# Patient Record
Sex: Female | Born: 1960 | Race: White | Hispanic: No | Marital: Married | Smoking: Never smoker
Health system: Southern US, Community
[De-identification: ages and names within clinical notes are randomized; demographics above are authoritative.]

## PROBLEM LIST (undated history)

## (undated) DIAGNOSIS — R896 Abnormal cytological findings in specimens from other organs, systems and tissues: Secondary | ICD-10-CM

## (undated) DIAGNOSIS — F32A Depression, unspecified: Secondary | ICD-10-CM

## (undated) DIAGNOSIS — F419 Anxiety disorder, unspecified: Secondary | ICD-10-CM

## (undated) DIAGNOSIS — M1990 Unspecified osteoarthritis, unspecified site: Secondary | ICD-10-CM

## (undated) DIAGNOSIS — H3530 Unspecified macular degeneration: Secondary | ICD-10-CM

## (undated) DIAGNOSIS — F329 Major depressive disorder, single episode, unspecified: Secondary | ICD-10-CM

## (undated) HISTORY — DX: Unspecified osteoarthritis, unspecified site: M19.90

## (undated) HISTORY — DX: Anxiety disorder, unspecified: F41.9

## (undated) HISTORY — DX: Unspecified macular degeneration: H35.30

## (undated) HISTORY — DX: Major depressive disorder, single episode, unspecified: F32.9

## (undated) HISTORY — DX: Abnormal cytological findings in specimens from other organs, systems and tissues: R89.6

## (undated) HISTORY — PX: BREAST CYST ASPIRATION: SHX578

## (undated) HISTORY — PX: TONSILLECTOMY AND ADENOIDECTOMY: SUR1326

## (undated) HISTORY — DX: Depression, unspecified: F32.A

---

## 1997-10-16 ENCOUNTER — Other Ambulatory Visit: Admission: RE | Admit: 1997-10-16 | Discharge: 1997-10-16 | Payer: Self-pay | Admitting: Obstetrics and Gynecology

## 1998-07-04 ENCOUNTER — Inpatient Hospital Stay (HOSPITAL_COMMUNITY): Admission: AD | Admit: 1998-07-04 | Discharge: 1998-07-06 | Payer: Self-pay | Admitting: Obstetrics and Gynecology

## 1998-08-26 ENCOUNTER — Other Ambulatory Visit: Admission: RE | Admit: 1998-08-26 | Discharge: 1998-08-26 | Payer: Self-pay | Admitting: Obstetrics and Gynecology

## 1999-11-25 ENCOUNTER — Other Ambulatory Visit: Admission: RE | Admit: 1999-11-25 | Discharge: 1999-11-25 | Payer: Self-pay | Admitting: Gynecology

## 2001-02-22 ENCOUNTER — Other Ambulatory Visit: Admission: RE | Admit: 2001-02-22 | Discharge: 2001-02-22 | Payer: Self-pay | Admitting: Gynecology

## 2002-03-07 ENCOUNTER — Other Ambulatory Visit: Admission: RE | Admit: 2002-03-07 | Discharge: 2002-03-07 | Payer: Self-pay | Admitting: Gynecology

## 2002-08-25 ENCOUNTER — Other Ambulatory Visit: Admission: RE | Admit: 2002-08-25 | Discharge: 2002-08-25 | Payer: Self-pay | Admitting: Gynecology

## 2003-04-19 ENCOUNTER — Other Ambulatory Visit: Admission: RE | Admit: 2003-04-19 | Discharge: 2003-04-19 | Payer: Self-pay | Admitting: Gynecology

## 2004-11-18 ENCOUNTER — Other Ambulatory Visit: Admission: RE | Admit: 2004-11-18 | Discharge: 2004-11-18 | Payer: Self-pay | Admitting: Gynecology

## 2005-12-01 ENCOUNTER — Other Ambulatory Visit: Admission: RE | Admit: 2005-12-01 | Discharge: 2005-12-01 | Payer: Self-pay | Admitting: Gynecology

## 2006-03-20 ENCOUNTER — Ambulatory Visit: Payer: Self-pay | Admitting: Family Medicine

## 2007-01-27 ENCOUNTER — Other Ambulatory Visit: Admission: RE | Admit: 2007-01-27 | Discharge: 2007-01-27 | Payer: Self-pay | Admitting: Gynecology

## 2007-04-28 ENCOUNTER — Other Ambulatory Visit: Admission: RE | Admit: 2007-04-28 | Discharge: 2007-04-28 | Payer: Self-pay | Admitting: Gynecology

## 2007-11-07 ENCOUNTER — Other Ambulatory Visit: Admission: RE | Admit: 2007-11-07 | Discharge: 2007-11-07 | Payer: Self-pay | Admitting: Gynecology

## 2007-11-07 ENCOUNTER — Ambulatory Visit: Payer: Self-pay | Admitting: Gynecology

## 2007-11-07 ENCOUNTER — Encounter: Payer: Self-pay | Admitting: Gynecology

## 2008-04-18 ENCOUNTER — Ambulatory Visit: Payer: Self-pay | Admitting: Gynecology

## 2008-04-18 ENCOUNTER — Other Ambulatory Visit: Admission: RE | Admit: 2008-04-18 | Discharge: 2008-04-18 | Payer: Self-pay | Admitting: Gynecology

## 2008-04-18 ENCOUNTER — Encounter: Payer: Self-pay | Admitting: Gynecology

## 2009-03-07 ENCOUNTER — Encounter: Admission: RE | Admit: 2009-03-07 | Discharge: 2009-03-07 | Payer: Self-pay | Admitting: Gynecology

## 2010-01-05 LAB — HM PAP SMEAR

## 2010-02-28 ENCOUNTER — Other Ambulatory Visit: Payer: Self-pay | Admitting: Gynecology

## 2010-02-28 DIAGNOSIS — Z1231 Encounter for screening mammogram for malignant neoplasm of breast: Secondary | ICD-10-CM

## 2010-03-11 ENCOUNTER — Ambulatory Visit: Payer: Self-pay

## 2010-03-17 ENCOUNTER — Ambulatory Visit
Admission: RE | Admit: 2010-03-17 | Discharge: 2010-03-17 | Disposition: A | Discharge status: Home / Self Care | Payer: Commercial Managed Care - PPO | Source: Ambulatory Visit | Attending: Gynecology | Admitting: Gynecology

## 2010-03-17 DIAGNOSIS — Z1231 Encounter for screening mammogram for malignant neoplasm of breast: Secondary | ICD-10-CM

## 2010-12-01 DIAGNOSIS — Z9852 Vasectomy status: Secondary | ICD-10-CM | POA: Insufficient documentation

## 2011-03-05 ENCOUNTER — Other Ambulatory Visit: Payer: Self-pay | Admitting: Obstetrics and Gynecology

## 2011-03-05 DIAGNOSIS — Z1231 Encounter for screening mammogram for malignant neoplasm of breast: Secondary | ICD-10-CM

## 2011-03-18 ENCOUNTER — Ambulatory Visit
Admission: RE | Admit: 2011-03-18 | Discharge: 2011-03-18 | Disposition: A | Discharge status: Home / Self Care | Payer: Commercial Managed Care - PPO | Source: Ambulatory Visit | Attending: Obstetrics and Gynecology | Admitting: Obstetrics and Gynecology

## 2011-03-18 DIAGNOSIS — Z1231 Encounter for screening mammogram for malignant neoplasm of breast: Secondary | ICD-10-CM

## 2011-03-18 LAB — HM MAMMOGRAPHY

## 2012-11-24 ENCOUNTER — Other Ambulatory Visit: Payer: Self-pay

## 2013-02-27 ENCOUNTER — Encounter: Payer: Self-pay | Admitting: Gastroenterology

## 2013-03-01 ENCOUNTER — Ambulatory Visit: Payer: Self-pay | Admitting: Obstetrics and Gynecology

## 2013-03-01 ENCOUNTER — Encounter: Payer: Self-pay | Admitting: Obstetrics and Gynecology

## 2013-03-01 ENCOUNTER — Ambulatory Visit (INDEPENDENT_AMBULATORY_CARE_PROVIDER_SITE_OTHER): Payer: Commercial Managed Care - PPO | Admitting: Obstetrics and Gynecology

## 2013-03-01 VITALS — BP 122/70 | HR 70 | Resp 16 | Ht 68.0 in | Wt 157.5 lb

## 2013-03-01 DIAGNOSIS — Z01419 Encounter for gynecological examination (general) (routine) without abnormal findings: Secondary | ICD-10-CM

## 2013-03-01 NOTE — Patient Instructions (Signed)

## 2013-03-01 NOTE — Progress Notes (Signed)
Screening Mammogram scheduled for 03/07/13 at 1630 [facility]. Patient agreeable to time/date/location.

## 2013-03-01 NOTE — Progress Notes (Signed)
Patient ID: Brenda Nicholson, female   DOB: 11/24/1960, 53 y.o.   MRN: [id number] GYNECOLOGY VISIT  PCP:   Rodrigo RanMark Perini, MD  Referring provider:   HPI: [id number] y.o.   Married  Caucasian  female   G3P3 with Patient's last menstrual period was 12/05/2012.   here for   AEX. Missed first menses ever. Menses usually heavy.  Notes an increase in anxiety and worrying.  History of depression.  Limiting ETOH use and drinks a lot of tea and coffee.  3 teen age children.  No hot flashes or night sweats.   Some urinary frequency.  Negative UA with PCP this week.   Hgb:    PCP - labs this week with PCP Urine:  PCP  GYNECOLOGIC HISTORY: Patient's last menstrual period was 12/05/2012. Sexually active:  yes Partner preference: female Contraception: vasectomy   Menopausal hormone therapy: n/a DES exposure: no  Blood transfusions:   no Sexually transmitted diseases:   no GYN procedures and prior surgeries:  no Last mammogram:  3-13- 2013 [facility]              Last pap and high risk HPV testing:  01/2010 WUJ:WJXBJYwnl:unsure of HPV testing History of abnormal pap smear:  no   OB History   Grav Para Term Preterm Abortions TAB SAB Ect Mult Living   3 3        3        LIFESTYLE: Exercise:  walking          Tobacco: no Alcohol:    4 glasses of wine per week Drug use:  no  OTHER HEALTH MAINTENANCE: Tetanus/TDap:  2013 Gardisil:              n/a Influenza:            Not in years Zostavax:            n/a  Bone density:      n/a Colonoscopy:      Scheduled for first screening colonoscopy 04/2013 with [facility].  Cholesterol check:   02-13-2011 wnl  Family History  Problem Relation Age of Onset  . Cancer Mother 4485    OVARIAN CANCER/1987  . Hypertension Mother   . Osteoporosis Mother   . Ovarian cancer Mother     deceased  . Rheum arthritis Paternal Grandmother     Patient Active Problem List   Diagnosis Date Noted  . H/O: vasectomy   . ASCUS (atypical squamous cells of undetermined  significance) on Pap smear    Past Medical History  Diagnosis Date  . ASCUS (atypical squamous cells of undetermined significance) on Pap smear 01/2007    NEG HIGH RISK HPV--BENIGN CHANGES ON 11/2007 PAP  . Anxiety   . Depression     in her 20's and off & on    Past Surgical History  Procedure Laterality Date  . Tonsillectomy and adenoidectomy      ALLERGIES: Review of patient's allergies indicates no known allergies.  No current outpatient prescriptions on file.   No current facility-administered medications for this visit.     ROS:  Pertinent items are noted in HPI.  SOCIAL HISTORY:  Married.  Writer/editor.   PHYSICAL EXAMINATION:    BP 122/70  Pulse 70  Resp 16  Ht 5\' 8"  (1.727 m)  Wt 157 lb 8 oz (71.442 kg)  BMI 23.95 kg/m2  LMP 12/05/2012   Wt Readings from Last 3 Encounters:  03/01/13 157 lb 8 oz (  71.442 kg)     Ht Readings from Last 3 Encounters:  03/01/13 5\' 8"  (1.727 m)    General appearance: alert, cooperative and appears stated age Head: Normocephalic, without obvious abnormality, atraumatic Neck: no adenopathy, supple, symmetrical, trachea midline and thyroid not enlarged, symmetric, no tenderness/mass/nodules Lungs: clear to auscultation bilaterally Breasts: Inspection negative, No nipple retraction or dimpling, No nipple discharge or bleeding, No axillary or supraclavicular adenopathy, Normal to palpation without dominant masses Heart: regular rate and rhythm Abdomen: soft, non-tender; no masses,  no organomegaly Extremities: extremities normal, atraumatic, no cyanosis or edema Skin: Skin color, texture, turgor normal. No rashes or lesions Lymph nodes: Cervical, supraclavicular, and axillary nodes normal. No abnormal inguinal nodes palpated Neurologic: Grossly normal  Pelvic: External genitalia:  no lesions              Urethra:  normal appearing urethra with no masses, tenderness or lesions              Bartholins and Skenes: normal                  Vagina: normal appearing vagina with normal color and discharge, no lesions              Cervix: normal appearance              Pap and high risk HPV testing done: yes.            Bimanual Exam:  Uterus:  uterus is normal size, shape, consistency and nontender                                      Adnexa: normal adnexa in size, nontender and no masses                                      Rectovaginal: Confirms                                      Anus:  normal sphincter tone, no lesions  ASSESSMENT  Normal gynecologic exam. Perimenopausal female. Anxiety. Urinary frequency.   PLAN  Mammogram recommended yearly.  Will schedule for patient today.  Pap smear and high risk HPV testing performed.  Reduce ETOH and caffeine use.  Referral to [facility].  Card given.  Counseled on self breast exam, Calcium and vitamin D intake, exercise, menopause.  Written information to patient about menopause.  Return annually or prn   An After Visit Summary was printed and given to the patient.

## 2013-03-03 LAB — IPS PAP TEST WITH HPV

## 2013-03-07 ENCOUNTER — Inpatient Hospital Stay: Admission: RE | Admit: 2013-03-07 | Payer: Commercial Managed Care - PPO | Source: Ambulatory Visit

## 2013-03-08 ENCOUNTER — Ambulatory Visit
Admission: RE | Admit: 2013-03-08 | Discharge: 2013-03-08 | Disposition: A | Discharge status: Home / Self Care | Payer: Commercial Managed Care - PPO | Source: Ambulatory Visit | Attending: Obstetrics and Gynecology | Admitting: Obstetrics and Gynecology

## 2013-03-08 DIAGNOSIS — Z01419 Encounter for gynecological examination (general) (routine) without abnormal findings: Secondary | ICD-10-CM

## 2013-05-01 ENCOUNTER — Encounter: Payer: Commercial Managed Care - PPO | Admitting: Gastroenterology

## 2013-05-10 DIAGNOSIS — H2510 Age-related nuclear cataract, unspecified eye: Secondary | ICD-10-CM | POA: Insufficient documentation

## 2013-05-10 DIAGNOSIS — Z8669 Personal history of other diseases of the nervous system and sense organs: Secondary | ICD-10-CM | POA: Insufficient documentation

## 2013-11-06 ENCOUNTER — Encounter: Payer: Self-pay | Admitting: Obstetrics and Gynecology

## 2013-11-24 ENCOUNTER — Telehealth: Payer: Self-pay

## 2013-11-24 NOTE — Telephone Encounter (Signed)
Spoke with patient. Patient states that she has had a constant pressure in her LLQ for 1 week. "I am not in pain. It is just a constant pressure that I feel. It is dull but I know it is there." Denies any bleeding, change in bowel habits, urinary symptoms, and nausea. Advised patient will need to be seen for evaluation. Patient is agreeable. Appointment scheduled for Monday at 10:30am with Verner Choleborah S. Leonard CNM. Patient is agreeable to date and time. Advised patient if symptoms worsen or any new symptoms will need to be seen sooner. Advised to call our office for earlier appointment or if anything changes over the weekend to be seen at [facility]. Patient is agreeable and verbalizes understanding.   Routing to Verner Choleborah S. Leonard CNM as seeing patient Cc: Dr.Silva  Routing to provider for final review. Patient agreeable to disposition. Will close encounter

## 2013-11-27 ENCOUNTER — Encounter: Payer: Self-pay | Admitting: Certified Nurse Midwife

## 2013-11-27 ENCOUNTER — Ambulatory Visit (INDEPENDENT_AMBULATORY_CARE_PROVIDER_SITE_OTHER): Payer: Commercial Managed Care - PPO | Admitting: Certified Nurse Midwife

## 2013-11-27 VITALS — BP 120/70 | HR 70 | Temp 98.0°F | Resp 16 | Ht 68.0 in | Wt 158.0 lb

## 2013-11-27 DIAGNOSIS — R102 Pelvic and perineal pain: Secondary | ICD-10-CM

## 2013-11-27 DIAGNOSIS — E559 Vitamin D deficiency, unspecified: Secondary | ICD-10-CM

## 2013-11-27 DIAGNOSIS — N912 Amenorrhea, unspecified: Secondary | ICD-10-CM

## 2013-11-27 DIAGNOSIS — N9489 Other specified conditions associated with female genital organs and menstrual cycle: Secondary | ICD-10-CM

## 2013-11-27 LAB — CBC WITH DIFFERENTIAL/PLATELET
BASOS PCT: 0 % (ref 0–1)
Basophils Absolute: 0 10*3/uL (ref 0.0–0.1)
Eosinophils Absolute: 0.1 10*3/uL (ref 0.0–0.7)
Eosinophils Relative: 3 % (ref 0–5)
HCT: 39 % (ref 36.0–46.0)
Hemoglobin: 13.5 g/dL (ref 12.0–15.0)
LYMPHS ABS: 1.4 10*3/uL (ref 0.7–4.0)
Lymphocytes Relative: 29 % (ref 12–46)
MCH: 30.5 pg (ref 26.0–34.0)
MCHC: 34.6 g/dL (ref 30.0–36.0)
MCV: 88.2 fL (ref 78.0–100.0)
MONO ABS: 0.4 10*3/uL (ref 0.1–1.0)
MONOS PCT: 8 % (ref 3–12)
MPV: 9.7 fL (ref 9.4–12.4)
NEUTROS PCT: 60 % (ref 43–77)
Neutro Abs: 2.9 10*3/uL (ref 1.7–7.7)
Platelets: 198 10*3/uL (ref 150–400)
RBC: 4.42 MIL/uL (ref 3.87–5.11)
RDW: 13.9 % (ref 11.5–15.5)
WBC: 4.8 10*3/uL (ref 4.0–10.5)

## 2013-11-27 LAB — POCT URINALYSIS DIPSTICK
Bilirubin, UA: NEGATIVE
GLUCOSE UA: NEGATIVE
KETONES UA: NEGATIVE
LEUKOCYTES UA: NEGATIVE
NITRITE UA: NEGATIVE
PH UA: 5
Protein, UA: NEGATIVE
RBC UA: NEGATIVE
UROBILINOGEN UA: NEGATIVE

## 2013-11-27 LAB — TSH: TSH: 1.835 u[IU]/mL (ref 0.350–4.500)

## 2013-11-27 NOTE — Patient Instructions (Signed)

## 2013-11-27 NOTE — Progress Notes (Signed)
53 y.o. Married white female  G3P3 here for complaint of pelvic pain particularly on left.  Pain started to be more uncomfortable over the past week.  Pain is primarily located in groin area. Patient has noticed some hot flashes and cramping like a period recently. Periods were normal until 03/05/13. She has not had a period since 3/15 which was normal. Denies any spotting also. Family history of ovarian cancer mother age 55. "says she is aware that there is no testing for, but concerned". Pain she has noted is a 2-3 and is occasional. Denies STD concerns, diarrhea, vomiting, urinary symptoms or vaginal symptoms. Patient has some fatigue and history of Vitamin D deficiency, which has not been assessed in over a year. Contraception: spouse vasectomy  Patient is sexually active with last sexual activity one week ago and no pain noted.  O: Healthy female WDWN in no apparent distress  Exam:   BP 120/70 mmHg  Pulse 70  Temp(Src) 98 F (36.7 C) (Oral)  Resp 16  Ht 5\' 8"  (1.727 m)  Wt 158 lb (71.668 kg)  BMI 24.03 kg/m2  LMP 03/05/2013  Skin: warm and dry Abdomen:  soft, non-tender; bowel sounds normal; no masses,  no organomegaly Lymph:  inguinal nodes not enlarged or tender   Pelvic: External genitalia:  no lesions and normal female              Urethra: not indicated and normal appearing urethra with no masses, tenderness or lesions              Bartholins and Skenes: Bartholin's, Urethra, Skene's normal                 Vagina: normal appearing vagina with normal color and discharge, no lesions              Cervix: normal appearance and non tender, cervical polyp noted in cervix, small              Pap taken: No. Bimanual Exam:  Uterus:  uterus is normal size, shape, consistency and nontender                               Adnexa:    normal adnexa in size, nontender and no masses and no masses                               Rectovaginal: Confirms                               Anus:  defer  exam  Wet prep was not obtained  . A: Normal Pelvic exam      Cervical polyp      Amenorrhea with menopausal symptoms, neg. UPT      History of Vit. D deficiency      Family history of Ovarian cancer mother age 55  P:Reviewed findings of normal pelvic and abdominal exam Discussed cervical polyp findings and benign in nature. Discussed bleeding which can be related to polyp if occurs. Discussed cramping could related to no period. Discussed perimenopausal and etiology and bleeding expectations. Also discussed Thyroid and pituitary change can also affect cycle. Discussed possible Provera challenge if needed, but will await labs. Recommend lab screening, patient agreeable  Labs:  TSH,FSH,Prolactin, Vit. D, CBC with diff. Discussed limited screening for  Ovarian cancer, genetic screening for breast cancer. Discussed PUS and Ca 125 and limitations. Patient will decide if she would like to go forward with.      An After Visit Summary was printed and given to the patient.

## 2013-11-28 ENCOUNTER — Telehealth: Payer: Self-pay | Admitting: Certified Nurse Midwife

## 2013-11-28 LAB — VITAMIN D 25 HYDROXY (VIT D DEFICIENCY, FRACTURES): Vit D, 25-Hydroxy: 46 ng/mL (ref 30–100)

## 2013-11-28 LAB — FOLLICLE STIMULATING HORMONE: FSH: 101.6 m[IU]/mL

## 2013-11-28 LAB — PROLACTIN: Prolactin: 6.1 ng/mL

## 2013-11-28 MED ORDER — MEDROXYPROGESTERONE ACETATE 10 MG PO TABS: 10.0000 mg | ORAL_TABLET | Freq: Every day | ORAL | Status: DC

## 2013-11-28 NOTE — Telephone Encounter (Signed)
Patient wanted to know fsh level from 11/28/13 told her it was 101.6  Routed to provider for review, encounter closed.

## 2013-11-28 NOTE — Telephone Encounter (Signed)
Left message to call back  

## 2013-11-28 NOTE — Telephone Encounter (Signed)
Patient calling with a follow up questions from this mornings call with results. Specifically, she has questions about her fsh levels.

## 2013-11-29 ENCOUNTER — Telehealth: Payer: Self-pay

## 2013-11-29 NOTE — Telephone Encounter (Signed)
Spoke with patient. Patient states that she is still experiencing pressure on her left side. "It is pretty persistent. I can still feel it. It is not causing me a lot of discomfort I just know that it is there. It has not gotten any worse. I don't even feel like I need to take any ibuprofen for it. I picked up that prescription for provera like Debbi recommended. Should I go ahead and start with that still?" Advised patient to go ahead and continue with plan to take Provera as instructed by Verner Choleborah S. Leonard CNM. Patient would like to monitor pressure for one more week and call in to schedule PUS if it does not resolve or if at any point it increases. Will call next week with an update.  Routing to provider for final review. Patient agreeable to disposition. Will close encounter

## 2013-11-29 NOTE — Progress Notes (Signed)
I would definitively do a PUS if pain does not resolve.  Reviewed personally.  Lum KeasM. Suzanne Carigan Lister, MD.

## 2013-11-29 NOTE — Telephone Encounter (Deleted)
-----   Message from Verner Choleborah S Leonard, CNM sent at 11/29/2013  8:06 AM EST -----   ----- Message -----    From: Annamaria BootsMary Suzanne Miller, MD    Sent: 11/29/2013   7:26 AM      To: Verner Choleborah S Leonard, CNM    ----- Message -----    From: Verner Choleborah S Leonard, CNM    Sent: 11/27/2013   8:43 PM      To: Annamaria BootsMary Suzanne Miller, MD

## 2013-11-29 NOTE — Progress Notes (Signed)
Please call patient and instruct to call if the pelvic pressure on left does not resolve or increases, She will need PUS to assess.

## 2013-11-29 NOTE — Telephone Encounter (Signed)
Mary Suzanne MilleAnnamaria Bootsr, MD at 11/29/2013 7:26 AM     Status: Signed       Expand All Collapse All   I would definitively do a PUS if pain does not resolve. Reviewed personally. Lum KeasM. Suzanne Miller, MD.             Verner Choleborah S Leonard, CNM at 11/29/2013 8:04 AM     Status: Signed       Expand All Collapse All   Please call patient and instruct to call if the pelvic pressure on left does not resolve or increases, She will need PUS to assess.

## 2013-12-05 ENCOUNTER — Telehealth: Payer: Self-pay

## 2013-12-05 DIAGNOSIS — R102 Pelvic and perineal pain: Secondary | ICD-10-CM

## 2013-12-05 NOTE — Telephone Encounter (Signed)
Spoke with patient at time of incoming call. Patient is calling to give update on left sided pressure. "It has still not gone away. It isn't really bad but it is still there and a little bit into my back now too." Patient was advised to call to schedule PUS if pressure did not go away or if it increased. Please see note from Dr.Miller and Verner Choleborah S. Leonard CNM below. Patient would like to schedule PUS at this time. Appointment scheduled for 12/3 at 2pm with 2:45pm consult with Dr.Miller. Patient is agreeable to date and time. Will call back to be seen sooner if pressure increases or has any new symptoms. Order placed for PUS will need precert.  Annamaria BootsMary Suzanne Miller, MD at 11/29/2013 7:26 AM     Status: Signed       Expand All Collapse All   I would definitively do a PUS if pain does not resolve. Reviewed personally. Lum KeasM. Suzanne Miller, MD.             Verner Choleborah S Leonard, CNM at 11/29/2013 8:04 AM     Status: Signed       Expand All Collapse All   Please call patient and instruct to call if the pelvic pressure on left does not resolve or increases, She will need PUS to assess.       Cc: Cathrine MusterSabrina Franklin for precert Cc: Verner Choleborah S. Leonard CNM   Routing to provider for final review. Patient agreeable to disposition. Will close encounter

## 2013-12-06 NOTE — Telephone Encounter (Signed)
Pr $20

## 2013-12-07 ENCOUNTER — Ambulatory Visit (INDEPENDENT_AMBULATORY_CARE_PROVIDER_SITE_OTHER): Payer: Commercial Managed Care - PPO

## 2013-12-07 ENCOUNTER — Ambulatory Visit (INDEPENDENT_AMBULATORY_CARE_PROVIDER_SITE_OTHER): Payer: Commercial Managed Care - PPO | Admitting: Obstetrics & Gynecology

## 2013-12-07 VITALS — BP 122/78 | Resp 12 | Ht 68.0 in

## 2013-12-07 DIAGNOSIS — R102 Pelvic and perineal pain: Secondary | ICD-10-CM

## 2013-12-07 DIAGNOSIS — R1032 Left lower quadrant pain: Secondary | ICD-10-CM

## 2013-12-07 DIAGNOSIS — Z1211 Encounter for screening for malignant neoplasm of colon: Secondary | ICD-10-CM

## 2013-12-07 DIAGNOSIS — Z8041 Family history of malignant neoplasm of ovary: Secondary | ICD-10-CM

## 2013-12-07 NOTE — Progress Notes (Signed)
53 y.o. Brenda RodneyG3P3 Marriedfemale here for a pelvic ultrasound due to left pelvic pain and pressure she has noted more on the left.  Pt seen by Lovett Soxebbi Leonard 11/27/13 forOV where pt reported concerns.  Denies bowel or bladder issues.  Also, has family hx of ovarian cancer in mother age 55.  Denies VB.  Pain is 2-3 on 10 pt scale and intermittent.    Patient's last menstrual period was 03/05/2013.  Sexually active:  yes  Contraception: vasectomy  FINDINGS: UTERUS: 8.4 x 5.4 x 3.9cm EMS: 9mm ADNEXA: Left ovary 2.5 x 2.0 x 1.5cm with follicles 12mm each   Right ovary 1.9 x 1.2 x 1.4cm with follicle 10mm 10mm CUL DE SAC: no free fluid  D/w pt findings.  Ovaries do not appear PMP nor does endometrial thickness considering FSH obtained 11/27/13.  Pt had started provera challenge so she will complete it.  If bleeds, she knows to call.  May need endometrial biopsy.  Also, I would like to know if she doesn't bleed.  Will not need provera challenge again if this happens.  Pt aware.  All questions answered.    Assessment:  Pelvic pressure/LLQ pain, small ovarian follicles, family hx of ovarian cancer in mother Plan: Pt will finish provera challenge and see if has any bleeding.  If so, will need endometrial biopsy and possibly SHGM due to ultrasound findings of endometrium of 9mm today.    Also, recommend colon cancer screening due to pain.  Pt is 5 years overdue.  Order will be placed.  All questions answered.    ~15 minutes spent with patient >50% of time was in face to face discussion of above.

## 2013-12-14 ENCOUNTER — Telehealth: Payer: Self-pay | Admitting: Obstetrics & Gynecology

## 2013-12-14 DIAGNOSIS — N950 Postmenopausal bleeding: Secondary | ICD-10-CM

## 2013-12-14 NOTE — Telephone Encounter (Signed)
Patient calling to report her menstrual period started five days after finishing Provera.

## 2013-12-14 NOTE — Telephone Encounter (Signed)
Dr. Hyacinth MeekerMiller, Can you review and advise if follow up needed.  Patient had FSH 101.6 on 11/27/13 and withdrawal bleeding after taking Provera 10 mg x 10 days.

## 2013-12-18 NOTE — Telephone Encounter (Signed)
[facility]'t [facility] had bleeding.  Needs endometrial biopsy.

## 2013-12-18 NOTE — Telephone Encounter (Signed)
Return call from patient. Advised of recommendations from Dr. Hyacinth MeekerMiller. Patient states bleeding continues like a regular cycle. Brief discussion of endometrial biopsy. Patient agreeable to schedule. Instructed to take Motrin 800 mg 1 hour prior with food. Appointment scheduled for December 17 at 2:15 with Dr. Hyacinth MeekerMiller, she is aware this is a work in appointment.  Patient declined appointment offered earlier in the week.  Encounter closed.

## 2013-12-18 NOTE — Telephone Encounter (Signed)
Call to patient. Left message to call back.  

## 2013-12-19 ENCOUNTER — Encounter: Payer: Self-pay | Admitting: Obstetrics & Gynecology

## 2013-12-19 DIAGNOSIS — Z1211 Encounter for screening for malignant neoplasm of colon: Secondary | ICD-10-CM | POA: Insufficient documentation

## 2013-12-19 DIAGNOSIS — R1032 Left lower quadrant pain: Secondary | ICD-10-CM | POA: Insufficient documentation

## 2013-12-19 DIAGNOSIS — Z8041 Family history of malignant neoplasm of ovary: Secondary | ICD-10-CM | POA: Insufficient documentation

## 2013-12-21 ENCOUNTER — Ambulatory Visit (INDEPENDENT_AMBULATORY_CARE_PROVIDER_SITE_OTHER): Payer: Commercial Managed Care - PPO | Admitting: Obstetrics & Gynecology

## 2013-12-21 VITALS — BP 126/80 | HR 76 | Resp 14 | Ht 68.0 in | Wt 158.6 lb

## 2013-12-21 DIAGNOSIS — N950 Postmenopausal bleeding: Secondary | ICD-10-CM

## 2013-12-21 NOTE — Progress Notes (Signed)
Patient ID: Brenda Nicholson, female   DOB: 11/03/1960, 53 y.o.   MRN: [id number]  53 yo G3P3 MWF here for possible endometrial biopsy due to PMP bleeding.  Pt has FSH obtained 11/27/13 which was 101.  Pt seen 12/07/13 due to LLQ pain and family hx of ovarian cancer in her mother.  Ultrasound was normal but endometrium was thickened at 9mm, considering FSH was 101.  Pt started on Provera challenge and she did bleed.  Reports pain has resolved.  For her, she doesn't "feel" menopausal but felt more like she just needed to have a cycle.  Therefore, it feels like the biopsy is too much.  She is not trying to "get out of" having it but she doesn't feel it is necessary.  D/w pt FSH of 101 with bleeding is not consistent.  She agrees.  However, this may be bouncing around some so could repeat FSH and check estradiol.  If this is similar to prior labs, then pt will return for endometrial biopsy.  If it is lower, then perimenopausal irregular bleeding is more likely and I think can be followed conservatively.  Pt very much in agreement with this plan.  Very glad that we can have an open conversation and not just have me "tell her what to do".  Assessment:  Possible PMP bleeding, 9mm endometrium on PUS 12/07/13 Pelvic pain that has resolved  Plan:  FSH and estradiol.  Pt will be called with results and will make plan from there.  ~15 minutes spent with patient >50% of time was in face to face discussion of above.

## 2013-12-22 LAB — FOLLICLE STIMULATING HORMONE: FSH: 37.6 m[IU]/mL

## 2013-12-22 LAB — ESTRADIOL: ESTRADIOL: 77.6 pg/mL

## 2013-12-25 ENCOUNTER — Telehealth: Payer: Self-pay

## 2013-12-25 NOTE — Telephone Encounter (Signed)
-----   Message from Annamaria BootsMary Suzanne Miller, MD sent at 12/22/2013  8:09 AM EST ----- Please call pt.  FSH was 37.6.  Estradiol was 77.  This is perimenopausal range and not full menopause so the bleeding episode is acceptable in this range.  I would like to know if/when she bleeds again just for me to stay up to date with that.  For now, ok to watch and not proceed with an endometrial biopsy.

## 2013-12-25 NOTE — Telephone Encounter (Signed)
Spoke with patient. Advised patient of results as seen below. Patient is agreeable and verbalizes understanding. Patient would like to schedule annual exam at this time. Appointment scheduled for 03/13/14 at 1:30pm with Dr.Miller. Patient is agreeable to date and time.  Routing to provider for final review. Patient agreeable to disposition. Will close encounter

## 2014-01-10 DIAGNOSIS — H5213 Myopia, bilateral: Secondary | ICD-10-CM | POA: Insufficient documentation

## 2014-02-15 ENCOUNTER — Other Ambulatory Visit: Payer: Self-pay

## 2014-02-15 DIAGNOSIS — Z1231 Encounter for screening mammogram for malignant neoplasm of breast: Secondary | ICD-10-CM

## 2014-03-06 ENCOUNTER — Telehealth: Payer: Self-pay | Admitting: Obstetrics & Gynecology

## 2014-03-06 NOTE — Telephone Encounter (Signed)
Called patient and left messages at both numbers for her to call back to reschedule her AEX with Dr. Hyacinth MeekerMiller.

## 2014-03-07 ENCOUNTER — Ambulatory Visit: Payer: Self-pay | Admitting: Obstetrics and Gynecology

## 2014-03-13 ENCOUNTER — Ambulatory Visit: Payer: Commercial Managed Care - PPO | Admitting: Obstetrics & Gynecology

## 2014-03-19 ENCOUNTER — Ambulatory Visit
Admission: RE | Admit: 2014-03-19 | Discharge: 2014-03-19 | Disposition: A | Discharge status: Home / Self Care | Payer: Commercial Managed Care - PPO | Source: Ambulatory Visit

## 2014-03-19 DIAGNOSIS — Z1231 Encounter for screening mammogram for malignant neoplasm of breast: Secondary | ICD-10-CM

## 2014-05-07 ENCOUNTER — Telehealth: Payer: Self-pay | Admitting: Obstetrics & Gynecology

## 2014-05-07 NOTE — Telephone Encounter (Signed)
Spoke with patient. She states she started a menstrual cycle on 04/26/14 it was not heavy but "period like" and now is having spotting. Patient states she thinks she knows the source of the pelvic pain that she had and feels she may have a hernia or "vein problem" at the top of her leg. She feels this was the source of the pain that she was having in December of 2015 and would like Dr. Hyacinth MeekerMiller to assess. Pain remains the same over the last 5 months. Denies new problem.  Patient would like to schedule annual exam if possible.   Advised would route message to Dr. Hyacinth MeekerMiller and request she review and return call. Okay to schedule annual exam for patient as she is perimenopausal and bleeding was to be monitored?

## 2014-05-07 NOTE — Telephone Encounter (Signed)
OK to put in the AEX slot.  Thanks.

## 2014-05-07 NOTE — Telephone Encounter (Signed)
Patient calling stating she is "having pelvic bleeding and pain again." She called wanting to schedule her AEX with Dr. Hyacinth MeekerMiller but reports she is also having a problem. There is an AEX slot open to triage 05/14/14 at 2 PM. I did not offer it to the patient so we can address her problem first but FYI.

## 2014-05-08 NOTE — Telephone Encounter (Signed)
Spoke with patient and annual exam with Dr Hyacinth MeekerMiller scheduled for 05/11/14 at 1400. Routing to provider for final review. Patient agreeable to disposition. Patient aware MD will review message and nurse will return call with any additional instructions or change of disposition. Will close encounter.

## 2014-05-08 NOTE — Telephone Encounter (Signed)
Message left to return call to [place] at [phone].    

## 2014-05-11 ENCOUNTER — Ambulatory Visit (INDEPENDENT_AMBULATORY_CARE_PROVIDER_SITE_OTHER): Payer: Commercial Managed Care - PPO | Admitting: Obstetrics & Gynecology

## 2014-05-11 ENCOUNTER — Encounter: Payer: Self-pay | Admitting: Obstetrics & Gynecology

## 2014-05-11 VITALS — BP 144/82 | HR 64 | Resp 16 | Ht 67.75 in | Wt 159.8 lb

## 2014-05-11 DIAGNOSIS — R599 Enlarged lymph nodes, unspecified: Secondary | ICD-10-CM

## 2014-05-11 DIAGNOSIS — Z01419 Encounter for gynecological examination (general) (routine) without abnormal findings: Secondary | ICD-10-CM

## 2014-05-11 DIAGNOSIS — Z124 Encounter for screening for malignant neoplasm of cervix: Secondary | ICD-10-CM | POA: Diagnosis not present

## 2014-05-11 DIAGNOSIS — R590 Localized enlarged lymph nodes: Secondary | ICD-10-CM

## 2014-05-11 LAB — CBC WITH DIFFERENTIAL/PLATELET
BASOS ABS: 0 10*3/uL (ref 0.0–0.1)
Basophils Relative: 0 % (ref 0–1)
EOS PCT: 2 % (ref 0–5)
Eosinophils Absolute: 0.1 10*3/uL (ref 0.0–0.7)
HEMATOCRIT: 39.7 % (ref 36.0–46.0)
HEMOGLOBIN: 13.5 g/dL (ref 12.0–15.0)
LYMPHS ABS: 1.4 10*3/uL (ref 0.7–4.0)
LYMPHS PCT: 27 % (ref 12–46)
MCH: 30.8 pg (ref 26.0–34.0)
MCHC: 34 g/dL (ref 30.0–36.0)
MCV: 90.4 fL (ref 78.0–100.0)
MONO ABS: 0.4 10*3/uL (ref 0.1–1.0)
MPV: 9.8 fL (ref 8.6–12.4)
Monocytes Relative: 8 % (ref 3–12)
NEUTROS ABS: 3.3 10*3/uL (ref 1.7–7.7)
Neutrophils Relative %: 63 % (ref 43–77)
Platelets: 215 10*3/uL (ref 150–400)
RBC: 4.39 MIL/uL (ref 3.87–5.11)
RDW: 14.7 % (ref 11.5–15.5)
WBC: 5.3 10*3/uL (ref 4.0–10.5)

## 2014-05-11 NOTE — Progress Notes (Signed)
54 y.o. G3P3 MarriedCaucasianF here for annual exam.  Had a "real" cycle in April.  Lasted 7 days.  Felt just like a cycle to her.  Pt had FSH of 101 and then one month later had an FSH of 37.  Estradiol was 77 at that time too.    Feels she has figured out the LLQ pain.  Had noted something in her groin that is achy and mildly tender.  This is the "pain" she experienced earlier.  In retrospect, feels she has experienced this off and on for several years.   PCP:  Dr. Waynard EdwardsPerini.  Last seen in February.  Blood work then was nromal.    Patient's last menstrual period was 04/26/2014.          Sexually active: Yes.    The current method of family planning is vasectomy.    Exercising: Yes.    walking and yoga Smoker:  no  Health Maintenance: Pap:  03/01/13 WNL/negative HR HPV History of abnormal Pap:  no MMG:  03/19/14 3D-normal Colonoscopy:  2/16-repeat in 10 years BMD:   none TDaP:  02/13/11 Screening Labs: PCP, Hb today: PCP, Urine today: PCP   reports that she has never smoked. She has never used smokeless tobacco. She reports that she drinks alcohol. She reports that she does not use illicit drugs.  Past Medical History  Diagnosis Date  . ASCUS (atypical squamous cells of undetermined significance) on Pap smear 01/2007    NEG HIGH RISK HPV--BENIGN CHANGES ON 11/2007 PAP  . Anxiety   . Depression     in her 20's and off & on    Past Surgical History  Procedure Laterality Date  . Tonsillectomy and adenoidectomy      Current Outpatient Prescriptions  Medication Sig Dispense Refill  . medroxyPROGESTERone (PROVERA) 10 MG tablet Take 1 tablet (10 mg total) by mouth daily. (Patient not taking: Reported on 12/21/2013) 10 tablet 0   No current facility-administered medications for this visit.    Family History  Problem Relation Age of Onset  . Cancer Mother [id number]    OVARIAN CANCER/1987  . Hypertension Mother   . Osteoporosis Mother   . Ovarian cancer Mother     deceased  . Rheum  arthritis Paternal Grandmother     ROS:  Pertinent items are noted in HPI.  Otherwise, a comprehensive ROS was negative.  Exam:   BP 144/82 mmHg  Pulse 64  Resp 16  Ht 5' 7.75" (1.721 m)  Wt 159 lb 12.8 oz (72.485 kg)  BMI 24.47 kg/m2  LMP 04/26/2014  Weight change: +2#   Height: 5' 7.75" (172.1 cm)  Ht Readings from Last 3 Encounters:  05/11/14 5' 7.75" (1.721 m)  12/21/13 5\' 8"  (1.727 m)  12/07/13 5\' 8"  (1.727 m)    General appearance: alert, cooperative and appears stated age Head: Normocephalic, without obvious abnormality, atraumatic Neck: no adenopathy, supple, symmetrical, trachea midline and thyroid normal to inspection and palpation Lungs: clear to auscultation bilaterally Breasts: normal appearance, no masses or tenderness Heart: regular rate and rhythm Abdomen: soft, non-tender; bowel sounds normal; no masses,  no organomegaly Extremities: extremities normal, atraumatic, no cyanosis or edema Skin: Skin color, texture, turgor normal. No rashes or lesions Lymph nodes: Cervical, supraclavicular, and axillary nodes normal.  Palpable, soft and mobile node in left groin area where she notes tenderness vs small hernia No abnormal inguinal nodes palpated Neurologic: Grossly normal   Pelvic: External genitalia:  no lesions  Urethra:  normal appearing urethra with no masses, tenderness or lesions              Bartholins and Skenes: normal                 Vagina: normal appearing vagina with normal color and discharge, no lesions              Cervix: no lesions              Pap taken: Yes.   Bimanual Exam:  Uterus:  normal size, contour, position, consistency, mobility, non-tender              Adnexa: normal adnexa and no mass, fullness, tenderness               Rectovaginal: Confirms               Anus:  normal sphincter tone, no lesions  Chaperone was present for exam.  A:  Normal gynecologic exam. Perimenopausal bleeding with fluctuating FSH Left groin  lymph node vs small hernia Urinary frequency Family hx of ovarian cancer  P:  Mammogram recommended yearly Pap smear today.  Neg HR HPV 2015 CBC with diff today.  All other labs with Dr. Waynard EdwardsPerini Pt will return for groin check 3-4 months.  Feel this is a lymph node but if changes will proceed with CT scan for additional evaluation.  Pt in agreement with plan. Return annually or prn

## 2014-05-14 ENCOUNTER — Ambulatory Visit (INDEPENDENT_AMBULATORY_CARE_PROVIDER_SITE_OTHER): Payer: Commercial Managed Care - PPO | Admitting: *Deleted

## 2014-05-14 ENCOUNTER — Telehealth: Payer: Self-pay

## 2014-05-14 VITALS — BP 102/60 | HR 80 | Temp 97.7°F | Ht 67.75 in | Wt 159.8 lb

## 2014-05-14 DIAGNOSIS — R300 Dysuria: Secondary | ICD-10-CM

## 2014-05-14 DIAGNOSIS — N3000 Acute cystitis without hematuria: Secondary | ICD-10-CM

## 2014-05-14 LAB — POCT URINALYSIS DIPSTICK
Urobilinogen, UA: NEGATIVE
pH, UA: 5

## 2014-05-14 NOTE — Progress Notes (Signed)
Patient is here to leave urine sample for analysis. Urinalysis: Rbc's ++, wbc ++ Patient says she had a little bit of burning over the weekend. Urine sent for micro and culture.  Patient is aware that someone will contact her in regards to results.  Routed to provider for review, encounter closed.

## 2014-05-14 NOTE — Telephone Encounter (Signed)
[facility]

## 2014-05-14 NOTE — Telephone Encounter (Signed)
-----   Message from Jerene BearsMary S Miller, MD sent at 05/12/2014  8:39 AM EDT ----- Inform CBC with diff is completely normal.  Will recheck with pt in 3 months.  She needs to call if area in left groin increases in size or tenderness before follow up appt.  Thanks.

## 2014-05-14 NOTE — Telephone Encounter (Signed)
Lmtcb//kn 

## 2014-05-15 LAB — URINALYSIS, MICROSCOPIC ONLY
CRYSTALS: NONE SEEN
Casts: NONE SEEN
Squamous Epithelial / LPF: NONE SEEN

## 2014-05-15 LAB — IPS PAP TEST WITH REFLEX TO HPV

## 2014-05-15 NOTE — Telephone Encounter (Signed)
Patient notified of results-see phone note.//kn

## 2014-05-17 ENCOUNTER — Telehealth: Payer: Self-pay | Admitting: Obstetrics & Gynecology

## 2014-05-17 LAB — URINE CULTURE: Colony Count: >=100000

## 2014-05-17 MED ORDER — SULFAMETHOXAZOLE-TRIMETHOPRIM 800-160 MG PO TABS: 1.0000 | ORAL_TABLET | Freq: Two times a day (BID) | ORAL | Status: DC

## 2014-05-17 NOTE — Telephone Encounter (Signed)
Pt calling for results. States she left specimen on Monday.

## 2014-05-17 NOTE — Telephone Encounter (Signed)
Left message to call Kaitlyn at [phone].  Notes Recorded by Jerene BearsMary S Miller, MD on 05/17/2014 at 8:41 AM Please call pt and let her know her urine culture was + for e coli. She needs bactrim DS bid x 5 days and repeat urine culture and micro in two weeks. Orders placed.

## 2014-05-17 NOTE — Telephone Encounter (Signed)
Spoke with patient. Advised of results as seen below from Dr.Miller. Patient is agreeable and verbalizes understanding. Rx for Bactrim DS bid x5 days sent to pharmacy on file. TOC appointment scheduled for 5/26 at 10:30am. Patient is agreeable to date and time.  Routing to provider for final review. Patient agreeable to disposition. Patient aware provider will review message and nurse will return call with any additional instructions or change of disposition. Will close encounter.

## 2014-05-17 NOTE — Addendum Note (Signed)
Addended by: Jerene BearsMILLER, Zanyla Klebba S on: 05/17/2014 08:43 AM   Modules accepted: Orders

## 2014-05-31 ENCOUNTER — Ambulatory Visit (INDEPENDENT_AMBULATORY_CARE_PROVIDER_SITE_OTHER): Payer: Commercial Managed Care - PPO | Admitting: *Deleted

## 2014-05-31 DIAGNOSIS — N3000 Acute cystitis without hematuria: Secondary | ICD-10-CM | POA: Diagnosis not present

## 2014-05-31 NOTE — Progress Notes (Signed)
Patient in today for TOC. Patient states she completed abx and denies any recurrent sx.  Advised pt to call if any sx reoccur. - pt verbalized understanding.

## 2014-06-01 LAB — URINALYSIS, MICROSCOPIC ONLY
BACTERIA UA: NONE SEEN
CRYSTALS: NONE SEEN
Casts: NONE SEEN

## 2014-06-02 LAB — URINE CULTURE
COLONY COUNT: NO GROWTH
ORGANISM ID, BACTERIA: NO GROWTH

## 2014-06-05 ENCOUNTER — Telehealth: Payer: Self-pay

## 2014-06-05 NOTE — Telephone Encounter (Signed)
-----   Message from Jerene BearsMary S Miller, MD sent at 06/03/2014  3:39 PM EDT ----- Inform culture negative.

## 2014-06-05 NOTE — Telephone Encounter (Signed)
Lmtcb//kn 

## 2014-06-07 NOTE — Telephone Encounter (Signed)
Patient notified of results.//kn 

## 2014-09-07 ENCOUNTER — Encounter: Payer: Self-pay | Admitting: Obstetrics & Gynecology

## 2014-09-07 ENCOUNTER — Ambulatory Visit (INDEPENDENT_AMBULATORY_CARE_PROVIDER_SITE_OTHER): Payer: Commercial Managed Care - PPO | Admitting: Obstetrics & Gynecology

## 2014-09-07 VITALS — BP 138/82 | HR 72 | Temp 98.1°F | Resp 16 | Wt 162.0 lb

## 2014-09-07 DIAGNOSIS — R1030 Lower abdominal pain, unspecified: Secondary | ICD-10-CM | POA: Diagnosis not present

## 2014-09-07 DIAGNOSIS — R1032 Left lower quadrant pain: Secondary | ICD-10-CM

## 2014-09-07 NOTE — Progress Notes (Signed)
Patient is scheduled for CT Pelvis with Contrast for 09/11/14 at 1600.  She is given contrast and written instructions.  She verbalized understanding of instructions and will follow up after results.

## 2014-09-07 NOTE — Progress Notes (Signed)
Subjective:     Patient ID: AUDRE CENCI, female   DOB: May 17, 1960, 54 y.o.   MRN: [id number]  HPI 54 yo G3P3 here for recheck of left groin tenderness, fullness, and now at times a bulge she's noticed.  Notes it is not present today except for the chronic ache that is present in this area.  Pt healthy but has a little anxiety that this may actually "be something".  Pt was seen within the last year for some PMP bleeding.  Pt reports she's not had any bleeding since April.  Denies hot flashes or vaginal dryness.  FSH drawn within the last year was 101 and 37.  Last Pap was in May, 2016, and was normal.    Review of Systems  All other systems reviewed and are negative.      Objective:   Physical Exam  Constitutional: She is oriented to person, place, and time. She appears well-developed and well-nourished.  Abdominal: Soft. Bowel sounds are normal. She exhibits no distension and no mass. There is tenderness (left groin area). There is no rebound and no guarding. Hernia confirmed negative in the right inguinal area and confirmed negative in the left inguinal area (no obvious hernia present).  Genitourinary: Uterus normal. There is no rash, tenderness, lesion or injury on the right labia. There is no rash, tenderness, lesion or injury on the left labia. Cervix exhibits no motion tenderness. Right adnexum displays no mass, no tenderness and no fullness. Left adnexum displays no mass, no tenderness and no fullness.  Lymphadenopathy:       Right: No inguinal adenopathy present.       Left: No inguinal adenopathy present.  Neurological: She is alert and oriented to person, place, and time.  Skin: Skin is warm and dry.  Psychiatric: She has a normal mood and affect.       Assessment:     Left groin/LLQ pain with fullness at times.  Hx consistent with hernia.     Plan:     Plan CT pelvic with PO contrast.  This will be scheduled and pt will be called with results.  Any additional  recommendations will be made at that time.

## 2014-09-11 ENCOUNTER — Telehealth: Payer: Self-pay | Admitting: Obstetrics & Gynecology

## 2014-09-11 ENCOUNTER — Other Ambulatory Visit: Payer: Commercial Managed Care - PPO

## 2014-09-11 NOTE — Telephone Encounter (Signed)
Spoke with patient. Patient states that over the long weekend she decided not to proceed with CT at this time. "I do not want to be exposed to all the radiation for it to possibly be nothing. I have had it for years now." Patient would like to speak with her PCP next week regarding CT and reschedule if needed. Patient states that she will return call if anything worsens or changes and she would like to proceed with CT scan.  Routing to provider for final review.

## 2014-09-11 NOTE — Telephone Encounter (Signed)
When looking in patient's appointment to document no need for precert for her ct at [facility] imaging, saw status that patient had cancelled her appointment with the following documented: Patient (Patient wants to cancel appointment and speak with her doctor first.) Please advise next steps with patient.

## 2014-09-13 NOTE — Telephone Encounter (Signed)
Order discontinued.  Will close encounter.  Out of imaging hold.

## 2014-09-13 NOTE — Telephone Encounter (Signed)
Yes.  Thanks.  Then ok to close encounter.

## 2014-09-13 NOTE — Telephone Encounter (Signed)
Dr. Hyacinth Meeker.  Patient cancelled appointment. Okay to remove from imaging hold?

## 2014-12-18 ENCOUNTER — Telehealth: Payer: Self-pay | Admitting: Obstetrics & Gynecology

## 2014-12-18 DIAGNOSIS — R1032 Left lower quadrant pain: Secondary | ICD-10-CM

## 2014-12-18 NOTE — Telephone Encounter (Signed)
Yes, this is fine to do PUS first.  We can discuss additional evaluation at that time.

## 2014-12-18 NOTE — Telephone Encounter (Signed)
Patient called and said, "I have been having groin pain for the past year and I'd like to see Dr. Hyacinth MeekerMiller about this since it's not getting better."   Paper chart to triage nurse.

## 2014-12-18 NOTE — Telephone Encounter (Signed)
Left message to call Adamae Ricklefs at [phone]. 

## 2014-12-18 NOTE — Telephone Encounter (Signed)
Spoke with patient. Patient states that she has been experiencing groin pain on her left side for one year. States the pain is persistent "It has not gotten worse, but it is not better." Dr.Miller recommended she has a CT scan in 09/2014. Patient did not wish to proceed with CT scan at that time. "I just got worried. Now I feel like I need more evaluation to rule out anything serious." Patient is requesting to have another PUS in office with Dr.Miller and then proceed with Ct scan if needed. Advised I will speak with Dr.Miller regarding recommendations and return call. Patient is agreeable.

## 2014-12-19 NOTE — Telephone Encounter (Signed)
Patient left a message on the voicemail at lunch returning call.

## 2014-12-19 NOTE — Telephone Encounter (Signed)
Spoke with patient. Advised of message as seen below from Dr.Miller. Patient would like to proceed with scheduling PUS. Appointment scheduled for 12/27/2014 at 1:30 pm with 2 pm consult with Dr.Miller. Agreeable to date and time. Order placed for precert.  Cc: Braxton Feathersebecca Frahm  Routing to provider for final review. Patient agreeable to disposition. Will close encounter.

## 2014-12-19 NOTE — Telephone Encounter (Signed)
Left message to call Kaitlyn at [phone]. 

## 2014-12-20 ENCOUNTER — Telehealth: Payer: Self-pay | Admitting: Obstetrics & Gynecology

## 2014-12-20 NOTE — Telephone Encounter (Signed)
Spoke with patient regarding benefit for scheduled pelvic ultrasound 12/27/14. Patient understood and agreeable. Patient agreeable to arrival date/time. Patient agreeable to 72 hour cancellation policy with $100 fee. No further questions. Ok to close.

## 2014-12-27 ENCOUNTER — Ambulatory Visit (INDEPENDENT_AMBULATORY_CARE_PROVIDER_SITE_OTHER): Payer: Commercial Managed Care - PPO | Admitting: Obstetrics & Gynecology

## 2014-12-27 ENCOUNTER — Ambulatory Visit (INDEPENDENT_AMBULATORY_CARE_PROVIDER_SITE_OTHER): Payer: Commercial Managed Care - PPO

## 2014-12-27 VITALS — BP 128/60 | HR 66 | Resp 18 | Ht 67.75 in | Wt 164.0 lb

## 2014-12-27 DIAGNOSIS — R1032 Left lower quadrant pain: Secondary | ICD-10-CM

## 2014-12-27 DIAGNOSIS — R1030 Lower abdominal pain, unspecified: Secondary | ICD-10-CM

## 2014-12-27 NOTE — Progress Notes (Signed)
54 y.o. Weyman RodneyG3P3 Marriedfemale here for a pelvic ultrasound to reassess left groin tenderness, sensation of fullness, and left groin ache that has been present now for several months.  Pt underwent initial evaluation for this in September.  Possible hernia with some fullness just left of pubic symphysis.  Pt was scheduled for CT and cancelled appt.  She did see Dr. Waynard EdwardsPerini and reports he did a "cough test" and did not find anything.  She continues to feel, at times, a small bulge.  She's done some research and questions whether pelvic venous congestion is the cause.  Just wants to know the answer and make sure "nothing is being missed".  No LMP recorded.  Sexually active:  yes  Contraception: vasectomy  FINDINGS: UTERUS: 7.7 x 4.9 x 3.8cm EMS: 4.978mm ADNEXA:   Left ovary 1.7 x 0.7 x 0.6cm   Right ovary 1.8 x 0.8 x 0.9cm CUL DE SAC: no free fluid  Ultrasound examination of groin does not show any abnormalities.  As well pelvic vasculature appears normal on ultrasound.  No evidence of pelvic venous congestion noted.  Exam:  Groin: there is a small bulge and fullness noted today on exam just left of pubic symphysis, in location where indirect hernia would occur.  Reviewed with pt findings on exam and ultrasound (essentially negative).  I really do feel that CT is the next step in evaluation and if she does have a hernia and would consider repair (which she states she wouldn't right now), then general surgery will require this for referral.  She understands this and continues to want to wait and see if there are any changes.  Pt aware she can call back at any time if she wants to proceed with additional testing.  Assessment:  Left groin pain Possible left indirect hernia  Plan: Recommend proceeding with CT of abdomen and pelvis at this time.  Pt declines but is aware she can call back at any time to have this scheduled.  All questions answered.    ~15 minutes spent with patient >50% of time was in face  to face discussion of above.

## 2014-12-28 ENCOUNTER — Encounter: Payer: Self-pay | Admitting: Obstetrics & Gynecology

## 2015-07-26 ENCOUNTER — Ambulatory Visit: Payer: Commercial Managed Care - PPO | Admitting: Obstetrics & Gynecology

## 2015-07-26 ENCOUNTER — Telehealth: Payer: Self-pay | Admitting: *Deleted

## 2015-07-26 NOTE — Telephone Encounter (Signed)
Pt cancelled annual exam appointment with Dr. Hyacinth MeekerMiller today stating she has a pet emergency.

## 2015-08-23 ENCOUNTER — Encounter: Payer: Self-pay | Admitting: Obstetrics & Gynecology

## 2015-08-23 ENCOUNTER — Ambulatory Visit (INDEPENDENT_AMBULATORY_CARE_PROVIDER_SITE_OTHER): Payer: Commercial Managed Care - PPO | Admitting: Obstetrics & Gynecology

## 2015-08-23 VITALS — BP 120/66 | HR 70 | Resp 16 | Ht 68.0 in | Wt 168.4 lb

## 2015-08-23 DIAGNOSIS — M25561 Pain in right knee: Secondary | ICD-10-CM

## 2015-08-23 DIAGNOSIS — Z01419 Encounter for gynecological examination (general) (routine) without abnormal findings: Secondary | ICD-10-CM

## 2015-08-23 DIAGNOSIS — Z0000 Encounter for general adult medical examination without abnormal findings: Secondary | ICD-10-CM

## 2015-08-23 DIAGNOSIS — M2550 Pain in unspecified joint: Secondary | ICD-10-CM | POA: Insufficient documentation

## 2015-08-23 DIAGNOSIS — Z205 Contact with and (suspected) exposure to viral hepatitis: Secondary | ICD-10-CM

## 2015-08-23 DIAGNOSIS — M25461 Effusion, right knee: Secondary | ICD-10-CM

## 2015-08-23 DIAGNOSIS — R768 Other specified abnormal immunological findings in serum: Secondary | ICD-10-CM

## 2015-08-23 LAB — CBC
HCT: 38.7 % (ref 35.0–45.0)
Hemoglobin: 13 g/dL (ref 11.7–15.5)
MCH: 30.2 pg (ref 27.0–33.0)
MCHC: 33.6 g/dL (ref 32.0–36.0)
MCV: 90 fL (ref 80.0–100.0)
MPV: 9.7 fL (ref 7.5–12.5)
Platelets: 194 K/uL (ref 140–400)
RBC: 4.3 MIL/uL (ref 3.80–5.10)
RDW: 14.1 % (ref 11.0–15.0)
WBC: 3.9 K/uL (ref 3.8–10.8)

## 2015-08-23 LAB — POCT URINALYSIS DIPSTICK
Bilirubin, UA: NEGATIVE
Blood, UA: NEGATIVE
Glucose, UA: NEGATIVE
Ketones, UA: NEGATIVE
Leukocytes, UA: NEGATIVE
Nitrite, UA: NEGATIVE
Protein, UA: NEGATIVE
Urobilinogen, UA: NEGATIVE
pH, UA: 5

## 2015-08-23 NOTE — Progress Notes (Signed)
55 y.o. G3P3 Married Caucasian F here for annual exam.  Doing well.  No vaginal bleeding.  Two kids in college.  Reports she has minimal vasomotor symptoms.  Reports having some issues with joint pain.  Started Prozac last fall and she stopped this to see if this helped with the joint pain.  Feels she needs to upper body assist to stand up.  Has stopped alcohol to see if this helped.  It hasn't.  She feels she was self medicating anxiety with alcohol.  She feels really good about not drinking anything.    Pt has noted increased swelling in right knee.  Denies trauma.  This is most bothersome joint but knees and ankles all hurt.  Pt cannot straighten right knee due to pain.  Patient's last menstrual period was 04/06/2014.          Sexually active: Yes.    The current method of family planning is vasectomy.    Exercising: Yes.    walking Smoker:  no  Health Maintenance: Pap:  05/11/2014 negative, neg pap and neg HR HPV 2/15 History of abnormal Pap:  no MMG:  03/20/2014 BIRADS 1 negative.  Pt aware this is due Colonoscopy:  02/26/2014 normal repeat 10 years BMD:   never TDaP:  02/13/2011  Pneumonia vaccine(s):  never Zostavax:   never Hep C testing: will discuss with PCP Screening Labs: PCP, Hb today: PCP, Urine today: normal    reports that she has never smoked. She has never used smokeless tobacco. She reports that she drinks alcohol. She reports that she does not use drugs.  Past Medical History:  Diagnosis Date  . Anxiety   . ASCUS (atypical squamous cells of undetermined significance) on Pap smear 01/2007   NEG HIGH RISK HPV--BENIGN CHANGES ON 11/2007 PAP  . Depression    in her 20's and off & on    Past Surgical History:  Procedure Laterality Date  . TONSILLECTOMY AND ADENOIDECTOMY      Current Outpatient Prescriptions  Medication Sig Dispense Refill  . PROZAC 20 MG capsule      No current facility-administered medications for this visit.     Family History  Problem  Relation Age of Onset  . Cancer Mother 24    OVARIAN CANCER/1987  . Hypertension Mother   . Osteoporosis Mother   . Ovarian cancer Mother     deceased  . Rheum arthritis Paternal Grandmother     ROS:  Pertinent items are noted in HPI.  Otherwise, a comprehensive ROS was negative.  Exam:   BP 120/66 (BP Location: Right Arm, Patient Position: Sitting)   Pulse 70   Resp 16   Ht 5' 8"  (1.727 m)   Wt 168 lb 6.4 oz (76.4 kg)   LMP 04/06/2014   BMI 25.61 kg/m   Weight change: +7#   Height: 5' 8"  (172.7 cm)  Ht Readings from Last 3 Encounters:  08/23/15 5' 8"  (1.727 m)  12/27/14 5' 7.75" (1.721 m)  05/31/14 5' 7.75" (1.721 m)    General appearance: alert, cooperative and appears stated age Head: Normocephalic, without obvious abnormality, atraumatic Neck: no adenopathy, supple, symmetrical, trachea midline and thyroid normal to inspection and palpation Lungs: clear to auscultation bilaterally Breasts: normal appearance, no masses or tenderness Heart: regular rate and rhythm Abdomen: soft, non-tender; bowel sounds normal; no masses,  no organomegaly Extremities: right knee is enlarged compared to left and is warm to the touch Skin: Skin color, texture, turgor normal. No rashes or  lesions Lymph nodes: Cervical, supraclavicular, and axillary nodes normal. No abnormal inguinal nodes palpated Neurologic: Grossly normal   Pelvic: External genitalia:  no lesions              Urethra:  normal appearing urethra with no masses, tenderness or lesions              Bartholins and Skenes: normal                 Vagina: normal appearing vagina with normal color and discharge, no lesions              Cervix: no lesions              Pap taken: No. Bimanual Exam:  Uterus:  normal size, contour, position, consistency, mobility, non-tender              Adnexa: normal adnexa and no mass, fullness, tenderness               Rectovaginal: Confirms               Anus:  normal sphincter tone, no  lesions  Chaperone was present for exam.  A:  Normal gynecologic exam PMP, no HRT Polyarthralgia and left knee enlargement with increased warmth Left groin node about 1cm, unchanged in size, mobile and soft.  PUS 12/16 was neg. Family hx of ovarian cancer, mother  P:  Mammogram recommended yearly Neg pap 2016.  Neg HR HPV 2015. CBC, ESR, ANA, CMP, RF Hep C Antibody Will continue to monitor Left groin node.  Will consider biopsy if increases in size. Will consider changing to Cymbalta if blood work is all negative.  HRT is an option too if has side effects with SNRI. Referral to Dr. Lynann Bologna will be done for pt Return annually or prn  ~In addition to annual gyn exam, additional 15 minutes spent with patient in face to face discussion of joint pain, evaluation, referral, and possible relation to menopause.  All of this time was in face to face discussion of above.

## 2015-08-24 LAB — RHEUMATOID FACTOR

## 2015-08-24 LAB — COMPREHENSIVE METABOLIC PANEL
ALT: 18 U/L (ref 6–29)
AST: 20 U/L (ref 10–35)
Albumin: 4.3 g/dL (ref 3.6–5.1)
Alkaline Phosphatase: 48 U/L (ref 33–130)
BILIRUBIN TOTAL: 0.5 mg/dL (ref 0.2–1.2)
BUN: 13 mg/dL (ref 7–25)
CHLORIDE: 104 mmol/L (ref 98–110)
CO2: 22 mmol/L (ref 20–31)
CREATININE: 0.79 mg/dL (ref 0.50–1.05)
Calcium: 8.6 mg/dL (ref 8.6–10.4)
GLUCOSE: 95 mg/dL (ref 65–99)
Potassium: 4.5 mmol/L (ref 3.5–5.3)
SODIUM: 142 mmol/L (ref 135–146)
Total Protein: 6.2 g/dL (ref 6.1–8.1)

## 2015-08-24 LAB — SEDIMENTATION RATE: SED RATE: 1 mm/h (ref 0–30)

## 2015-08-24 LAB — HEPATITIS C ANTIBODY: HCV Ab: NEGATIVE

## 2015-08-26 LAB — ANTI-NUCLEAR AB-TITER (ANA TITER)

## 2015-08-26 LAB — ANA: ANA: POSITIVE — AB

## 2015-08-28 NOTE — Addendum Note (Signed)
Addended by: Jerene BearsMILLER, Krishang Reading S on: 08/28/2015 06:58 AM   Modules accepted: Orders

## 2015-08-30 ENCOUNTER — Telehealth: Payer: Self-pay | Admitting: Obstetrics & Gynecology

## 2015-08-30 NOTE — Telephone Encounter (Signed)
Patient calling regarding a referral to a rheumatologist.

## 2015-12-05 DIAGNOSIS — H442A1 Degenerative myopia with choroidal neovascularization, right eye: Secondary | ICD-10-CM | POA: Insufficient documentation

## 2015-12-05 DIAGNOSIS — H25813 Combined forms of age-related cataract, bilateral: Secondary | ICD-10-CM | POA: Insufficient documentation

## 2016-01-06 HISTORY — PX: CATARACT EXTRACTION: SUR2

## 2016-01-09 DIAGNOSIS — H4423 Degenerative myopia, bilateral: Secondary | ICD-10-CM | POA: Diagnosis not present

## 2016-01-09 DIAGNOSIS — Z8669 Personal history of other diseases of the nervous system and sense organs: Secondary | ICD-10-CM | POA: Diagnosis not present

## 2016-01-09 DIAGNOSIS — H25813 Combined forms of age-related cataract, bilateral: Secondary | ICD-10-CM | POA: Diagnosis not present

## 2016-01-16 DIAGNOSIS — H4423 Degenerative myopia, bilateral: Secondary | ICD-10-CM | POA: Diagnosis not present

## 2016-01-16 DIAGNOSIS — H25813 Combined forms of age-related cataract, bilateral: Secondary | ICD-10-CM | POA: Diagnosis not present

## 2016-01-16 DIAGNOSIS — Z8669 Personal history of other diseases of the nervous system and sense organs: Secondary | ICD-10-CM | POA: Diagnosis not present

## 2016-01-31 ENCOUNTER — Other Ambulatory Visit: Payer: Self-pay | Admitting: Obstetrics & Gynecology

## 2016-01-31 DIAGNOSIS — Z1231 Encounter for screening mammogram for malignant neoplasm of breast: Secondary | ICD-10-CM

## 2016-02-06 DIAGNOSIS — H25813 Combined forms of age-related cataract, bilateral: Secondary | ICD-10-CM | POA: Diagnosis not present

## 2016-02-06 DIAGNOSIS — H4423 Degenerative myopia, bilateral: Secondary | ICD-10-CM | POA: Diagnosis not present

## 2016-02-06 DIAGNOSIS — Z8669 Personal history of other diseases of the nervous system and sense organs: Secondary | ICD-10-CM | POA: Diagnosis not present

## 2016-02-26 DIAGNOSIS — H2513 Age-related nuclear cataract, bilateral: Secondary | ICD-10-CM | POA: Diagnosis not present

## 2016-02-26 DIAGNOSIS — H4423 Degenerative myopia, bilateral: Secondary | ICD-10-CM | POA: Diagnosis not present

## 2016-02-26 DIAGNOSIS — Z8669 Personal history of other diseases of the nervous system and sense organs: Secondary | ICD-10-CM | POA: Diagnosis not present

## 2016-03-03 ENCOUNTER — Ambulatory Visit: Payer: Commercial Managed Care - PPO

## 2016-03-05 DIAGNOSIS — H2513 Age-related nuclear cataract, bilateral: Secondary | ICD-10-CM | POA: Diagnosis not present

## 2016-03-05 DIAGNOSIS — H442A1 Degenerative myopia with choroidal neovascularization, right eye: Secondary | ICD-10-CM | POA: Diagnosis not present

## 2016-03-05 DIAGNOSIS — H4423 Degenerative myopia, bilateral: Secondary | ICD-10-CM | POA: Diagnosis not present

## 2016-03-16 ENCOUNTER — Ambulatory Visit: Payer: Commercial Managed Care - PPO

## 2016-04-09 DIAGNOSIS — H4423 Degenerative myopia, bilateral: Secondary | ICD-10-CM | POA: Diagnosis not present

## 2016-04-09 DIAGNOSIS — H2513 Age-related nuclear cataract, bilateral: Secondary | ICD-10-CM | POA: Diagnosis not present

## 2016-04-09 DIAGNOSIS — H2512 Age-related nuclear cataract, left eye: Secondary | ICD-10-CM | POA: Diagnosis not present

## 2016-04-09 DIAGNOSIS — H442A1 Degenerative myopia with choroidal neovascularization, right eye: Secondary | ICD-10-CM | POA: Diagnosis not present

## 2016-04-16 DIAGNOSIS — H25812 Combined forms of age-related cataract, left eye: Secondary | ICD-10-CM | POA: Diagnosis not present

## 2016-04-16 DIAGNOSIS — H25813 Combined forms of age-related cataract, bilateral: Secondary | ICD-10-CM | POA: Diagnosis not present

## 2016-04-23 DIAGNOSIS — Z8669 Personal history of other diseases of the nervous system and sense organs: Secondary | ICD-10-CM | POA: Diagnosis not present

## 2016-04-23 DIAGNOSIS — H442A1 Degenerative myopia with choroidal neovascularization, right eye: Secondary | ICD-10-CM | POA: Diagnosis not present

## 2016-04-23 DIAGNOSIS — T8522XA Displacement of intraocular lens, initial encounter: Secondary | ICD-10-CM | POA: Insufficient documentation

## 2016-04-28 DIAGNOSIS — H4302 Vitreous prolapse, left eye: Secondary | ICD-10-CM | POA: Diagnosis not present

## 2016-04-28 DIAGNOSIS — Z961 Presence of intraocular lens: Secondary | ICD-10-CM | POA: Diagnosis not present

## 2016-04-28 DIAGNOSIS — T8522XA Displacement of intraocular lens, initial encounter: Secondary | ICD-10-CM | POA: Diagnosis not present

## 2016-04-28 DIAGNOSIS — Z9842 Cataract extraction status, left eye: Secondary | ICD-10-CM | POA: Diagnosis not present

## 2016-05-28 DIAGNOSIS — H442A1 Degenerative myopia with choroidal neovascularization, right eye: Secondary | ICD-10-CM | POA: Diagnosis not present

## 2016-06-08 DIAGNOSIS — M7662 Achilles tendinitis, left leg: Secondary | ICD-10-CM | POA: Diagnosis not present

## 2016-06-19 DIAGNOSIS — M1711 Unilateral primary osteoarthritis, right knee: Secondary | ICD-10-CM | POA: Diagnosis not present

## 2016-06-24 DIAGNOSIS — M25561 Pain in right knee: Secondary | ICD-10-CM | POA: Diagnosis not present

## 2016-07-02 ENCOUNTER — Other Ambulatory Visit: Payer: Self-pay | Admitting: Specialist

## 2016-07-02 DIAGNOSIS — M238X1 Other internal derangements of right knee: Secondary | ICD-10-CM

## 2016-07-14 ENCOUNTER — Ambulatory Visit
Admission: RE | Admit: 2016-07-14 | Discharge: 2016-07-14 | Disposition: A | Discharge status: Home / Self Care | Payer: Commercial Managed Care - PPO | Source: Ambulatory Visit | Attending: Specialist | Admitting: Specialist

## 2016-07-14 DIAGNOSIS — M238X1 Other internal derangements of right knee: Secondary | ICD-10-CM

## 2016-07-14 DIAGNOSIS — M7121 Synovial cyst of popliteal space [Baker], right knee: Secondary | ICD-10-CM | POA: Diagnosis not present

## 2016-07-17 ENCOUNTER — Other Ambulatory Visit: Payer: Commercial Managed Care - PPO

## 2016-07-20 DIAGNOSIS — M1711 Unilateral primary osteoarthritis, right knee: Secondary | ICD-10-CM | POA: Diagnosis not present

## 2016-07-22 DIAGNOSIS — M1711 Unilateral primary osteoarthritis, right knee: Secondary | ICD-10-CM | POA: Diagnosis not present

## 2016-07-23 DIAGNOSIS — Z8669 Personal history of other diseases of the nervous system and sense organs: Secondary | ICD-10-CM | POA: Diagnosis not present

## 2016-07-23 DIAGNOSIS — H442A1 Degenerative myopia with choroidal neovascularization, right eye: Secondary | ICD-10-CM | POA: Diagnosis not present

## 2016-07-23 DIAGNOSIS — T8522XA Displacement of intraocular lens, initial encounter: Secondary | ICD-10-CM | POA: Diagnosis not present

## 2016-07-23 DIAGNOSIS — H4423 Degenerative myopia, bilateral: Secondary | ICD-10-CM | POA: Diagnosis not present

## 2016-07-30 DIAGNOSIS — M1711 Unilateral primary osteoarthritis, right knee: Secondary | ICD-10-CM | POA: Diagnosis not present

## 2016-08-03 DIAGNOSIS — M1711 Unilateral primary osteoarthritis, right knee: Secondary | ICD-10-CM | POA: Diagnosis not present

## 2016-08-06 DIAGNOSIS — M1711 Unilateral primary osteoarthritis, right knee: Secondary | ICD-10-CM | POA: Diagnosis not present

## 2016-08-11 DIAGNOSIS — M1711 Unilateral primary osteoarthritis, right knee: Secondary | ICD-10-CM | POA: Diagnosis not present

## 2016-08-13 DIAGNOSIS — H442A1 Degenerative myopia with choroidal neovascularization, right eye: Secondary | ICD-10-CM | POA: Diagnosis not present

## 2016-08-24 DIAGNOSIS — M1711 Unilateral primary osteoarthritis, right knee: Secondary | ICD-10-CM | POA: Diagnosis not present

## 2016-09-24 DIAGNOSIS — T8522XA Displacement of intraocular lens, initial encounter: Secondary | ICD-10-CM | POA: Diagnosis not present

## 2016-09-24 DIAGNOSIS — H442A1 Degenerative myopia with choroidal neovascularization, right eye: Secondary | ICD-10-CM | POA: Diagnosis not present

## 2016-09-24 DIAGNOSIS — Z8669 Personal history of other diseases of the nervous system and sense organs: Secondary | ICD-10-CM | POA: Diagnosis not present

## 2016-10-01 DIAGNOSIS — H52203 Unspecified astigmatism, bilateral: Secondary | ICD-10-CM | POA: Diagnosis not present

## 2016-10-01 DIAGNOSIS — H5213 Myopia, bilateral: Secondary | ICD-10-CM | POA: Diagnosis not present

## 2016-10-01 DIAGNOSIS — H524 Presbyopia: Secondary | ICD-10-CM | POA: Diagnosis not present

## 2016-10-26 DIAGNOSIS — M238X1 Other internal derangements of right knee: Secondary | ICD-10-CM | POA: Diagnosis not present

## 2016-10-26 DIAGNOSIS — M1711 Unilateral primary osteoarthritis, right knee: Secondary | ICD-10-CM | POA: Diagnosis not present

## 2016-11-03 DIAGNOSIS — M1711 Unilateral primary osteoarthritis, right knee: Secondary | ICD-10-CM | POA: Diagnosis not present

## 2016-11-05 DIAGNOSIS — H442A1 Degenerative myopia with choroidal neovascularization, right eye: Secondary | ICD-10-CM | POA: Diagnosis not present

## 2016-11-05 DIAGNOSIS — Z8669 Personal history of other diseases of the nervous system and sense organs: Secondary | ICD-10-CM | POA: Diagnosis not present

## 2016-11-05 DIAGNOSIS — T8522XA Displacement of intraocular lens, initial encounter: Secondary | ICD-10-CM | POA: Diagnosis not present

## 2016-11-12 DIAGNOSIS — M1711 Unilateral primary osteoarthritis, right knee: Secondary | ICD-10-CM | POA: Diagnosis not present

## 2016-11-19 NOTE — Progress Notes (Deleted)
56 y.o. G3P3 MarriedCaucasianF here for annual exam.    Patient's last menstrual period was 04/26/2014.          Sexually active: {yes no:314532}  The current method of family planning is post menopausal status.    Exercising: {yes no:314532}  {types:19826} Smoker:  {YES NO:22349}  Health Maintenance: Pap:  05/11/14 Neg   03/01/13 Neg. HR HPV:Neg  History of abnormal Pap:  no MMG:  03/19/14 BIRADS1:Neg  Colonoscopy:  02/26/14 Normal. F/u 10 years BMD:   *** TDaP: 2013  Pneumonia vaccine(s):  *** Zostavax:   *** Hep C testing: 08/23/15 Neg  Screening Labs: ***, Hb today: ***, Urine today: ***   reports that  has never smoked. she has never used smokeless tobacco. She reports that she drinks alcohol. She reports that she does not use drugs.  Past Medical History:  Diagnosis Date  . Anxiety   . ASCUS (atypical squamous cells of undetermined significance) on Pap smear 01/2007   NEG HIGH RISK HPV--BENIGN CHANGES ON 11/2007 PAP  . Depression    in her 20's and off & on    Past Surgical History:  Procedure Laterality Date  . TONSILLECTOMY AND ADENOIDECTOMY      Current Outpatient Medications  Medication Sig Dispense Refill  . PROZAC 20 MG capsule      No current facility-administered medications for this visit.     Family History  Problem Relation Age of Onset  . Cancer Mother 6785       OVARIAN CANCER/1987  . Hypertension Mother   . Osteoporosis Mother   . Ovarian cancer Mother        deceased  . Rheum arthritis Paternal Grandmother     ROS:  Pertinent items are noted in HPI.  Otherwise, a comprehensive ROS was negative.  Exam:   LMP 04/26/2014   Weight change: @WEIGHTCHANGE @ Height:      Ht Readings from Last 3 Encounters:  08/23/15 5\' 8"  (1.727 m)  12/27/14 5' 7.75" (1.721 m)  05/31/14 5' 7.75" (1.721 m)    General appearance: alert, cooperative and appears stated age Head: Normocephalic, without obvious abnormality, atraumatic Neck: no adenopathy, supple,  symmetrical, trachea midline and thyroid {EXAM; THYROID:18604} Lungs: clear to auscultation bilaterally Breasts: {Exam; breast:13139::"normal appearance, no masses or tenderness"} Heart: regular rate and rhythm Abdomen: soft, non-tender; bowel sounds normal; no masses,  no organomegaly Extremities: extremities normal, atraumatic, no cyanosis or edema Skin: Skin color, texture, turgor normal. No rashes or lesions Lymph nodes: Cervical, supraclavicular, and axillary nodes normal. No abnormal inguinal nodes palpated Neurologic: Grossly normal   Pelvic: External genitalia:  no lesions              Urethra:  normal appearing urethra with no masses, tenderness or lesions              Bartholins and Skenes: normal                 Vagina: normal appearing vagina with normal color and discharge, no lesions              Cervix: {exam; cervix:14595}              Pap taken: {yes no:314532} Bimanual Exam:  Uterus:  {exam; uterus:12215}              Adnexa: {exam; adnexa:12223}               Rectovaginal: Confirms  Anus:  normal sphincter tone, no lesions  Chaperone was present for exam.  A:  Well Woman with normal exam  P:   {plan; gyn:5269::"mammogram","pap smear","return annually or prn"}

## 2016-11-20 ENCOUNTER — Ambulatory Visit: Payer: Commercial Managed Care - PPO | Admitting: Obstetrics & Gynecology

## 2016-12-03 DIAGNOSIS — H442A1 Degenerative myopia with choroidal neovascularization, right eye: Secondary | ICD-10-CM | POA: Diagnosis not present

## 2016-12-03 DIAGNOSIS — Z8669 Personal history of other diseases of the nervous system and sense organs: Secondary | ICD-10-CM | POA: Diagnosis not present

## 2016-12-03 DIAGNOSIS — T8522XA Displacement of intraocular lens, initial encounter: Secondary | ICD-10-CM | POA: Diagnosis not present

## 2016-12-04 ENCOUNTER — Other Ambulatory Visit: Payer: Self-pay

## 2016-12-04 ENCOUNTER — Other Ambulatory Visit (HOSPITAL_COMMUNITY)
Admission: RE | Admit: 2016-12-04 | Discharge: 2016-12-04 | Disposition: A | Discharge status: Home / Self Care | Payer: Commercial Managed Care - PPO | Source: Ambulatory Visit | Attending: Obstetrics & Gynecology | Admitting: Obstetrics & Gynecology

## 2016-12-04 ENCOUNTER — Encounter: Payer: Self-pay | Admitting: Obstetrics & Gynecology

## 2016-12-04 ENCOUNTER — Ambulatory Visit: Payer: Commercial Managed Care - PPO | Admitting: Obstetrics & Gynecology

## 2016-12-04 VITALS — BP 118/76 | HR 60 | Resp 16 | Ht 67.5 in | Wt 170.0 lb

## 2016-12-04 DIAGNOSIS — Z01419 Encounter for gynecological examination (general) (routine) without abnormal findings: Secondary | ICD-10-CM | POA: Diagnosis not present

## 2016-12-04 DIAGNOSIS — Z124 Encounter for screening for malignant neoplasm of cervix: Secondary | ICD-10-CM

## 2016-12-04 DIAGNOSIS — R768 Other specified abnormal immunological findings in serum: Secondary | ICD-10-CM

## 2016-12-04 MED ORDER — FLUOXETINE HCL 20 MG PO CAPS: 20.0000 mg | ORAL_CAPSULE | Freq: Every day | ORAL | 4 refills | Status: DC

## 2016-12-04 NOTE — Progress Notes (Signed)
56 y.o. G3P3 MarriedCaucasianF here for annual exam.  Has been having eye issues this year due to cataracts and a new diagnosis of myopic macular degeneration.  Having injections with Avastin.  Did have the left cataract removed.  Has some complications with the procedure that had to be repeated.  Despite all of this, she is doing really well.  Being followed about four weeks.    Denies vaginal bleeding.    Had knee MRI showing some osteoarthritis.  Having left hip pain now as well.    Feels like she is "very old".  Reports she almost "slides down the stairs" instead of actually taking steps down.  Used to walk her dog every day and hurts too much to do this now.  Asked last year if this was normal and she did some testing that showed elevated ANA.  Referral was made to rheumatology but she needed to change this appt.  Then all of the issues with her eye occurred and she just had to put rheumatology on hold.    Patient's last menstrual period was 04/26/2014.          Sexually active: Yes.   female The current method of family planning is vasectomy.    Exercising: Yes.    Swimming and walking Smoker:  no   Health Maintenance: Pap:  05/11/2014 negative, neg pap and neg HR HPV 2/15 History of abnormal Pap:  no MMG:  03/20/2014 BIRADS 1 negative Colonoscopy:   02/26/2014 normal repeat 10 years BMD:   never TDaP:  2013 Pneumonia vaccine(s):  no Zostavax:   no Hep C testing: negative 08/23/15 Screening Labs: PCP, Hb today: PCP, Urine today: not done   reports that  has never smoked. she has never used smokeless tobacco. She reports that she does not drink alcohol or use drugs.  Past Medical History:  Diagnosis Date  . Anxiety   . ASCUS (atypical squamous cells of undetermined significance) on Pap smear 01/2007   NEG HIGH RISK HPV--BENIGN CHANGES ON 11/2007 PAP  . Depression    in her 3620's and off & on  . Myopic macular degeneration   . Osteoarthritis    Rt.knee    Past Surgical History:   Procedure Laterality Date  . CATARACT EXTRACTION Left 2018  . TONSILLECTOMY AND ADENOIDECTOMY      Current Outpatient Medications  Medication Sig Dispense Refill  . FLUoxetine (PROZAC) 20 MG capsule Take 20 mg by mouth daily.    . Melatonin 10 MG TABS Take 10 mg by mouth at bedtime.    . Multiple Vitamin (MULTI-VITAMINS) TABS Take 1 tablet by mouth daily.     No current facility-administered medications for this visit.     Family History  Problem Relation Age of Onset  . Cancer Mother [id number]       OVARIAN CANCER/1987  . Hypertension Mother   . Osteoporosis Mother   . Ovarian cancer Mother        deceased  . Rheum arthritis Paternal Grandmother     ROS:  Pertinent items are noted in HPI.  Otherwise, a comprehensive ROS was negative.  Exam:   BP 118/76 (BP Location: Right Arm, Patient Position: Sitting, Cuff Size: Normal)   Pulse 60   Resp 16   Ht 5' 7.5" (1.715 m)   Wt 170 lb (77.1 kg)   LMP 04/26/2014   BMI 26.23 kg/m   Weight change:  +2# Height: 5' 7.5" (171.5 cm)  Ht Readings from Last 3 Encounters:  12/04/16 5' 7.5" (1.715 m)  08/23/15 5\' 8"  (1.727 m)  12/27/14 5' 7.75" (1.721 m)    General appearance: alert, cooperative and appears stated age Head: Normocephalic, without obvious abnormality, atraumatic Neck: no adenopathy, supple, symmetrical, trachea midline and thyroid normal to inspection and palpation Lungs: clear to auscultation bilaterally Breasts: normal appearance, no masses or tenderness Heart: regular rate and rhythm Abdomen: soft, non-tender; bowel sounds normal; no masses,  no organomegaly Extremities: extremities normal, atraumatic, no cyanosis or edema Skin: Skin color, texture, turgor normal. No rashes or lesions Lymph nodes: Cervical, supraclavicular, and axillary nodes normal. No abnormal inguinal nodes palpated Neurologic: Grossly normal   Pelvic: External genitalia:  no lesions              Urethra:  normal appearing urethra with no  masses, tenderness or lesions              Bartholins and Skenes: normal                 Vagina: normal appearing vagina with normal color and discharge, no lesions              Cervix: no lesions              Pap taken: Yes.   Bimanual Exam:  Uterus:  normal size, contour, position, consistency, mobility, non-tender              Adnexa: normal adnexa and no mass, fullness, tenderness               Rectovaginal: Confirms               Anus:  normal sphincter tone, no lesions  Chaperone was present for exam.  A:  Well Woman with normal exam PMP, no HRT Polyarthralgia and fatigue, elevated ANA.  Referred to rheumatology last year but pt had to cancel appt and she never went Family hx of ovarian cancer in her mother  P:   Mammogram is overdue.  Pt is aware.  She will schedule this. Pap smear with HR HPV obtained today RF for Prozac 20mg  daily.  Rx to pharmacy on file. Referral to rheumatology done today Return annually or prn

## 2016-12-08 LAB — CYTOLOGY - PAP
Diagnosis: NEGATIVE
HPV: NOT DETECTED

## 2016-12-09 DIAGNOSIS — Z008 Encounter for other general examination: Secondary | ICD-10-CM | POA: Diagnosis not present

## 2016-12-09 DIAGNOSIS — E538 Deficiency of other specified B group vitamins: Secondary | ICD-10-CM | POA: Diagnosis not present

## 2016-12-09 DIAGNOSIS — Z0000 Encounter for general adult medical examination without abnormal findings: Secondary | ICD-10-CM | POA: Diagnosis not present

## 2016-12-09 DIAGNOSIS — E559 Vitamin D deficiency, unspecified: Secondary | ICD-10-CM | POA: Diagnosis not present

## 2016-12-18 DIAGNOSIS — Z1389 Encounter for screening for other disorder: Secondary | ICD-10-CM | POA: Diagnosis not present

## 2016-12-18 DIAGNOSIS — R7989 Other specified abnormal findings of blood chemistry: Secondary | ICD-10-CM | POA: Diagnosis not present

## 2016-12-18 DIAGNOSIS — E538 Deficiency of other specified B group vitamins: Secondary | ICD-10-CM | POA: Diagnosis not present

## 2016-12-18 DIAGNOSIS — Z23 Encounter for immunization: Secondary | ICD-10-CM | POA: Diagnosis not present

## 2016-12-18 DIAGNOSIS — Z780 Asymptomatic menopausal state: Secondary | ICD-10-CM | POA: Diagnosis not present

## 2016-12-18 DIAGNOSIS — Z0000 Encounter for general adult medical examination without abnormal findings: Secondary | ICD-10-CM | POA: Diagnosis not present

## 2016-12-23 DIAGNOSIS — M1711 Unilateral primary osteoarthritis, right knee: Secondary | ICD-10-CM | POA: Diagnosis not present

## 2017-01-04 ENCOUNTER — Ambulatory Visit
Admission: RE | Admit: 2017-01-04 | Discharge: 2017-01-04 | Disposition: A | Discharge status: Home / Self Care | Payer: Commercial Managed Care - PPO | Source: Ambulatory Visit | Attending: Obstetrics & Gynecology | Admitting: Obstetrics & Gynecology

## 2017-01-04 DIAGNOSIS — Z1231 Encounter for screening mammogram for malignant neoplasm of breast: Secondary | ICD-10-CM

## 2017-01-08 NOTE — Progress Notes (Signed)
Office Visit Note  Patient: Brenda Nicholson             Date of Birth: [birth year]           MRN: [id number]             PCP: Crist Infante, MD Referring: Megan Salon, MD Visit Date: 01/12/2017 Occupation: writer/ Editor    Subjective:  Joint pain.   History of Present Illness: Brenda Nicholson is a 57 y.o. female seen in consultation per quest of Dr. Sabra Heck. According to patient her symptoms is started in January 2016 with right knee joint pain and swelling. She states at the time she was evaluated by Dr. Theda Sers who gave her cortisone injection. As the symptoms persist she has few more cortisone injections followed by visco supplement injections. She had minimal help from that . She states due to lack of activity she has gained weight. She also had MRI in July 2018 which was consistent with osteoarthritis especially involving the lateral compartment and a Baker's cyst. She also had some degenerative changes to the meniscus. She recalls in 2016 she also had some labs done by her GYN which were positive for ANA. Since then she has had left eye cataract surgery and she's been getting injections in her right eye for macular degeneration. She states none of the other joints are painful. She feels stiff in the morning and she has difficulty getting out of the chair. She had some discomfort in her left hip from favoring her right knee a few months back. She's also noticed some thickening in her right Achilles tendon but she has no history of Achillis tendinitis or plantar fasciitis. There is no history of any illness prior to starting of her right knee joint pain and discomfort. There is no history of oral ulcers nasal ulcers diarrhea conjunctivitis urethritis or psoriasis. There is no family history of autoimmune disease except that her son has type 1 diabetes.  Activities of Daily Living:  Patient reports morning stiffness for 10  minutes.   Patient Denies nocturnal pain.  Difficulty  dressing/grooming: Reports Difficulty climbing stairs: Reports Difficulty getting out of chair: Reports Difficulty using hands for taps, buttons, cutlery, and/or writing: Denies   Review of Systems  Constitutional: Negative.  Negative for fatigue, night sweats, weight gain, weight loss and weakness.  HENT: Positive for ear ringing. Negative for mouth sores, trouble swallowing, trouble swallowing, mouth dryness and nose dryness.   Eyes: Positive for dryness. Negative for pain, redness and visual disturbance.  Respiratory: Negative.  Negative for cough, shortness of breath and difficulty breathing.   Cardiovascular: Negative.  Negative for chest pain, palpitations, hypertension, irregular heartbeat and swelling in legs/feet.  Gastrointestinal: Positive for constipation. Negative for blood in stool and diarrhea.  Endocrine: Negative.  Negative for increased urination.  Genitourinary: Negative.  Negative for nocturia and vaginal dryness.  Musculoskeletal: Positive for arthralgias, joint pain, joint swelling and morning stiffness. Negative for myalgias, muscle weakness, muscle tenderness and myalgias.  Skin: Positive for color change. Negative for rash, hair loss, skin tightness, ulcers and sensitivity to sunlight.  Allergic/Immunologic: Negative for susceptible to infections.  Neurological: Negative.  Negative for dizziness, headaches, memory loss and night sweats.  Hematological: Negative.  Negative for swollen glands.  Psychiatric/Behavioral: Positive for depressed mood and sleep disturbance. The patient is nervous/anxious.     PMFS History:  Patient Active Problem List   Diagnosis Date Noted  . Status post left cataract extraction 01/12/2017  .  Primary osteoarthritis of right knee 01/12/2017  . History of anxiety 01/12/2017  . History of depression 01/12/2017  . Dislocated intraocular lens 04/23/2016  . Degenerative myopia with choroidal neovascularization, right eye 12/05/2015  .  Combined forms of age-related cataract of both eyes 12/05/2015  . Polyarthralgia 08/23/2015  . Myopia of both eyes 01/10/2014  . Family history of ovarian cancer 12/19/2013  . H/O amblyopia 05/10/2013  . Nuclear sclerosis 05/10/2013  . H/O: vasectomy     Past Medical History:  Diagnosis Date  . Anxiety   . ASCUS (atypical squamous cells of undetermined significance) on Pap smear 01/2007   NEG HIGH RISK HPV--BENIGN CHANGES ON 11/2007 PAP  . Depression    in her 2's and off & on  . Myopic macular degeneration   . Osteoarthritis    Rt.knee    Family History  Problem Relation Age of Onset  . Cancer Mother 79       OVARIAN CANCER/1987  . Hypertension Mother   . Osteoporosis Mother   . Ovarian cancer Mother        deceased  . Rheum arthritis Paternal Grandmother    Past Surgical History:  Procedure Laterality Date  . BREAST CYST ASPIRATION    . CATARACT EXTRACTION Left 2018  . TONSILLECTOMY AND ADENOIDECTOMY     Social History   Social History Narrative  . Not on file     Objective: Vital Signs: BP 115/66   Pulse 83   Resp 14   Ht 5' 8"  (1.727 m)   Wt 170 lb (77.1 kg)   LMP 04/26/2014   BMI 25.85 kg/m    Physical Exam  Constitutional: She is oriented to person, place, and time. She appears well-developed and well-nourished.  HENT:  Head: Normocephalic and atraumatic.  Eyes: Conjunctivae and EOM are normal.  Neck: Normal range of motion.  Cardiovascular: Normal rate, regular rhythm, normal heart sounds and intact distal pulses.  Pulmonary/Chest: Effort normal and breath sounds normal.  Abdominal: Soft. Bowel sounds are normal.  Lymphadenopathy:    She has no cervical adenopathy.  Neurological: She is alert and oriented to person, place, and time.  Skin: Skin is warm and dry. Capillary refill takes less than 2 seconds.  Psychiatric: She has a normal mood and affect. Her behavior is normal.  Nursing note and vitals reviewed.    Musculoskeletal Exam:  C-spine and thoracic lumbar spine good range of motion. She had no SI joint tenderness. Shoulder joints elbow joints wrist joints MCPs PIPs DIPs with good range of motion. She had no synovitis. Hip joints are good range of motion. She has no trochanteric bursitis. She had warmth swelling and a small effusion in her right knee joint.  CDAI Exam: No CDAI exam completed.    Investigation: No additional findings.   Imaging: Mm Screening Breast Tomo Bilateral  Result Date: 01/06/2017 CLINICAL DATA:  Screening. EXAM: 2D DIGITAL SCREENING BILATERAL MAMMOGRAM WITH CAD AND ADJUNCT TOMO COMPARISON:  Previous exam(s). ACR Breast Density Category c: The breast tissue is heterogeneously dense, which may obscure small masses. FINDINGS: There are no findings suspicious for malignancy. Images were processed with CAD. IMPRESSION: No mammographic evidence of malignancy. A result letter of this screening mammogram will be mailed directly to the patient. RECOMMENDATION: Screening mammogram in one year. (Code:SM-B-01Y) BI-RADS CATEGORY  1: Negative. Electronically Signed   By: Dorise Bullion III M.D   On: 01/06/2017 10:53   Xr Knee 3 View Right  Result Date: 01/12/2017 Severe lateral  compartment narrowing with lateral osteophytes. Valgus deformity of the knee joint noted. Intercondylar osteophytes present. Medial osteophyte present. No chondrocalcinosis was noted. Mild patellofemoral narrowing is noted. Impression: Osteoarthritis of right knee joint with severe lateral compartment narrowing and mild chondromalacia patella.   Speciality Comments: No specialty comments available.    Procedures:  No procedures performed Allergies: Patient has no known allergies.   Assessment / Plan:     Visit Diagnoses: Chronic pain of right knee -patient gives history of recurrent right knee joint pain and swelling for the last few years. She has had cortisone injections and Visco supplement injections. She has also gained  weight due to inactivity. She has tried physical therapy and is still notices weakness in her right lower extremity due to chronic pain and swelling. None of the other joints are painful or swollen today. Plan: XR KNEE 3 VIEW RIGHT, x-ray revealed severe lateral compartment narrowing with valgus deformity. No chondrocalcinosis was noted. I'll obtain following labs today. Sedimentation rate, Rheumatoid factor, Cyclic citrul peptide antibody, IgG, ANA, Angiotensin converting enzyme, HLA-B27 antigen, Uric acid. She had palpable nodule on her right Achilles tendon which was mildly tender but she does not have any Achillis tendinitis or plantar fasciitis.  Primary osteoarthritis of right knee - dxd by Dr. Theda Sers status post cortisone injection and Visco supplement injections.  Positive ANA (antinuclear antibody) - ANA 1:160 homogeneous, RF negative, ESR 1 -these labs were obtained couple of years ago. I will repeat the following labs today. Plan: Anti-scleroderma antibody, RNP Antibody, Anti-Smith antibody, Sjogrens syndrome-A extractable nuclear antibody, Sjogrens syndrome-B extractable nuclear antibody, Anti-DNA antibody, double-stranded, C3 and C4  Raynaud's disease without gangrene: Patient gives history of long-standing Raynolds for multiple years. She is not a smoker. She had no nail bed capillary changes on examination.  Other fatigue - Plan: CBC with Differential/Platelet, COMPLETE METABOLIC PANEL WITH GFR, Urinalysis, Routine w reflex microscopic, CK, TSH, Glucose 6 phosphate dehydrogenase  History of anxiety  History of depression  Myopic macular degeneration  Status post left cataract extraction    Orders: Orders Placed This Encounter  Procedures  . XR KNEE 3 VIEW RIGHT  . CBC with Differential/Platelet  . COMPLETE METABOLIC PANEL WITH GFR  . Urinalysis, Routine w reflex microscopic  . CK  . TSH  . Sedimentation rate  . Rheumatoid factor  . Cyclic citrul peptide antibody, IgG  .  ANA  . Anti-scleroderma antibody  . RNP Antibody  . Anti-Smith antibody  . Sjogrens syndrome-A extractable nuclear antibody  . Sjogrens syndrome-B extractable nuclear antibody  . Anti-DNA antibody, double-stranded  . C3 and C4  . Angiotensin converting enzyme  . HLA-B27 antigen  . Glucose 6 phosphate dehydrogenase  . Uric acid   No orders of the defined types were placed in this encounter.   Face-to-face time spent with patient was 45 minutes. Greater than 50% of time was spent in counseling and coordination of care.  Follow-Up Instructions: Return for Osteoarthritis, positive ANA.   Bo Merino, MD  Note - This record has been created using Editor, commissioning.  Chart creation errors have been sought, but may not always  have been located. Such creation errors do not reflect on  the standard of medical care.

## 2017-01-12 ENCOUNTER — Ambulatory Visit (INDEPENDENT_AMBULATORY_CARE_PROVIDER_SITE_OTHER): Payer: Self-pay

## 2017-01-12 ENCOUNTER — Other Ambulatory Visit: Payer: Self-pay | Admitting: Rheumatology

## 2017-01-12 ENCOUNTER — Ambulatory Visit: Payer: Commercial Managed Care - PPO | Admitting: Rheumatology

## 2017-01-12 ENCOUNTER — Encounter: Payer: Self-pay | Admitting: Rheumatology

## 2017-01-12 VITALS — BP 115/66 | HR 83 | Resp 14 | Ht 68.0 in | Wt 170.0 lb

## 2017-01-12 DIAGNOSIS — H3530 Unspecified macular degeneration: Secondary | ICD-10-CM

## 2017-01-12 DIAGNOSIS — Z8659 Personal history of other mental and behavioral disorders: Secondary | ICD-10-CM

## 2017-01-12 DIAGNOSIS — I7300 Raynaud's syndrome without gangrene: Secondary | ICD-10-CM

## 2017-01-12 DIAGNOSIS — Z9842 Cataract extraction status, left eye: Secondary | ICD-10-CM | POA: Diagnosis not present

## 2017-01-12 DIAGNOSIS — R768 Other specified abnormal immunological findings in serum: Secondary | ICD-10-CM

## 2017-01-12 DIAGNOSIS — M1711 Unilateral primary osteoarthritis, right knee: Secondary | ICD-10-CM | POA: Diagnosis not present

## 2017-01-12 DIAGNOSIS — G8929 Other chronic pain: Secondary | ICD-10-CM | POA: Diagnosis not present

## 2017-01-12 DIAGNOSIS — R5383 Other fatigue: Secondary | ICD-10-CM | POA: Diagnosis not present

## 2017-01-12 DIAGNOSIS — M25561 Pain in right knee: Secondary | ICD-10-CM | POA: Diagnosis not present

## 2017-01-12 NOTE — Patient Instructions (Signed)

## 2017-01-13 NOTE — Progress Notes (Signed)
Will discuss results at fu appointment

## 2017-01-15 LAB — COMPLETE METABOLIC PANEL WITH GFR
AG RATIO: 1.9 (calc) (ref 1.0–2.5)
ALBUMIN MSPROF: 4 g/dL (ref 3.6–5.1)
ALKALINE PHOSPHATASE (APISO): 54 U/L (ref 33–130)
ALT: 18 U/L (ref 6–29)
AST: 16 U/L (ref 10–35)
BUN: 19 mg/dL (ref 7–25)
CO2: 30 mmol/L (ref 20–32)
Calcium: 9.2 mg/dL (ref 8.6–10.4)
Chloride: 105 mmol/L (ref 98–110)
Creat: 0.86 mg/dL (ref 0.50–1.05)
GFR, EST NON AFRICAN AMERICAN: 74 mL/min/{1.73_m2} (ref >=60)
GFR, Est African American: 86 mL/min/{1.73_m2} (ref >=60)
Globulin: 2.1 g/dL (calc) (ref 1.9–3.7)
Glucose, Bld: 92 mg/dL (ref 65–99)
POTASSIUM: 4.4 mmol/L (ref 3.5–5.3)
Sodium: 141 mmol/L (ref 135–146)
Total Bilirubin: 0.4 mg/dL (ref 0.2–1.2)
Total Protein: 6.1 g/dL (ref 6.1–8.1)

## 2017-01-15 LAB — CBC WITH DIFFERENTIAL/PLATELET
BASOS ABS: 38 {cells}/uL (ref 0–200)
Basophils Relative: 0.8 %
EOS ABS: 160 {cells}/uL (ref 15–500)
Eosinophils Relative: 3.4 %
HEMATOCRIT: 39.4 % (ref 35.0–45.0)
HEMOGLOBIN: 13.6 g/dL (ref 11.7–15.5)
LYMPHS ABS: 1344 {cells}/uL (ref 850–3900)
MCH: 29.8 pg (ref 27.0–33.0)
MCHC: 34.5 g/dL (ref 32.0–36.0)
MCV: 86.2 fL (ref 80.0–100.0)
MPV: 10.5 fL (ref 7.5–12.5)
Monocytes Relative: 9.1 %
NEUTROS PCT: 58.1 %
Neutro Abs: 2731 cells/uL (ref 1500–7800)
Platelets: 217 10*3/uL (ref 140–400)
RBC: 4.57 10*6/uL (ref 3.80–5.10)
RDW: 13 % (ref 11.0–15.0)
Total Lymphocyte: 28.6 %
WBC: 4.7 10*3/uL (ref 3.8–10.8)
WBCMIX: 428 {cells}/uL (ref 200–950)

## 2017-01-15 LAB — ANTI-SMITH ANTIBODY: ENA SM Ab Ser-aCnc: <1 AI

## 2017-01-15 LAB — ANTI-SCLERODERMA ANTIBODY: SCLERODERMA (SCL-70) (ENA) ANTIBODY, IGG: NEGATIVE AI

## 2017-01-15 LAB — C3 AND C4
C3 COMPLEMENT: 111 mg/dL (ref 83–193)
C4 COMPLEMENT: 25 mg/dL (ref 15–57)

## 2017-01-15 LAB — HLA-B27 ANTIGEN: HLA-B27 ANTIGEN: NEGATIVE

## 2017-01-15 LAB — RHEUMATOID FACTOR: Rhuematoid fact SerPl-aCnc: <14 IU/mL (ref <14)

## 2017-01-15 LAB — GLUCOSE 6 PHOSPHATE DEHYDROGENASE: G-6PDH: 16.8 U/g Hgb (ref 7.0–20.5)

## 2017-01-15 LAB — SJOGRENS SYNDROME-A EXTRACTABLE NUCLEAR ANTIBODY: SSA (Ro) (ENA) Antibody, IgG: <1 AI

## 2017-01-15 LAB — CYCLIC CITRUL PEPTIDE ANTIBODY, IGG: Cyclic Citrullin Peptide Ab: <16 UNITS

## 2017-01-15 LAB — URINALYSIS, ROUTINE W REFLEX MICROSCOPIC: REDUCING SUBSTANCES: 3 % — ABNORMAL HIGH

## 2017-01-15 LAB — RNP ANTIBODY: Ribonucleic Protein(ENA) Antibody, IgG: <1 AI

## 2017-01-15 LAB — ANTI-NUCLEAR AB-TITER (ANA TITER)

## 2017-01-15 LAB — SEDIMENTATION RATE: SED RATE: 2 mm/h (ref 0–30)

## 2017-01-15 LAB — TSH: TSH: 2.81 mIU/L (ref 0.40–4.50)

## 2017-01-15 LAB — CK: CK TOTAL: 82 U/L (ref 29–143)

## 2017-01-15 LAB — ANGIOTENSIN CONVERTING ENZYME: ANGIOTENSIN-CONVERTING ENZYME: 30 U/L (ref 9–67)

## 2017-01-15 LAB — URIC ACID: URIC ACID, SERUM: 3.9 mg/dL (ref 2.5–7.0)

## 2017-01-15 LAB — ANA: Anti Nuclear Antibody(ANA): POSITIVE — AB

## 2017-01-15 LAB — SJOGRENS SYNDROME-B EXTRACTABLE NUCLEAR ANTIBODY: SSB (La) (ENA) Antibody, IgG: <1 AI

## 2017-01-15 LAB — ANTI-DNA ANTIBODY, DOUBLE-STRANDED: ds DNA Ab: 6 IU/mL — ABNORMAL HIGH

## 2017-01-18 LAB — URINALYSIS, ROUTINE W REFLEX MICROSCOPIC
BILIRUBIN URINE: NEGATIVE
GLUCOSE, UA: NEGATIVE
HGB URINE DIPSTICK: NEGATIVE
Ketones, ur: NEGATIVE
Leukocytes, UA: NEGATIVE
Nitrite: NEGATIVE
PROTEIN: NEGATIVE
Specific Gravity, Urine: 1.024 (ref 1.001–1.03)
pH: 6 (ref 5.0–8.0)

## 2017-01-20 NOTE — Progress Notes (Signed)
UA negative

## 2017-02-11 DIAGNOSIS — H442A1 Degenerative myopia with choroidal neovascularization, right eye: Secondary | ICD-10-CM | POA: Diagnosis not present

## 2017-02-11 DIAGNOSIS — Z8669 Personal history of other diseases of the nervous system and sense organs: Secondary | ICD-10-CM | POA: Diagnosis not present

## 2017-02-11 DIAGNOSIS — T8522XA Displacement of intraocular lens, initial encounter: Secondary | ICD-10-CM | POA: Diagnosis not present

## 2017-02-17 NOTE — Progress Notes (Signed)
Office Visit Note  Patient: Brenda Nicholson             Date of Birth: Jun 28, 1960           MRN: [id number]             PCP: Crist Infante, MD Referring: Crist Infante, MD Visit Date: 02/22/2017 Occupation: @GUAROCC @    Subjective:  Right knee pain and swelling   History of Present Illness: Brenda Nicholson is a 57 y.o. female with history of recurrent right knee joint pain and swelling.  She states she has been having increased pain and discomfort in her right knee recently.  She continues to have rainouts symptoms.  Activities of Daily Living:  Patient reports morning stiffness for 5 minutes.   Patient Denies nocturnal pain.  Difficulty dressing/grooming: Denies Difficulty climbing stairs: Reports Difficulty getting out of chair: Reports Difficulty using hands for taps, buttons, cutlery, and/or writing: Denies   Review of Systems  Constitutional: Negative for fatigue, fever, night sweats, weight gain, weight loss and weakness.  HENT: Negative for mouth sores, trouble swallowing, trouble swallowing, mouth dryness and nose dryness.   Eyes: Negative for pain, redness, visual disturbance and dryness.  Respiratory: Negative for cough, shortness of breath and difficulty breathing.   Cardiovascular: Negative for chest pain, palpitations, hypertension, irregular heartbeat and swelling in legs/feet.  Gastrointestinal: Negative for blood in stool, constipation, diarrhea and nausea.  Endocrine: Negative for heat intolerance and increased urination.  Genitourinary: Negative for pelvic pain and vaginal dryness.  Musculoskeletal: Positive for arthralgias, joint pain, joint swelling and morning stiffness. Negative for myalgias, muscle weakness, muscle tenderness and myalgias.  Skin: Positive for color change and rash. Negative for hair loss, skin tightness, ulcers and sensitivity to sunlight.  Allergic/Immunologic: Negative for susceptible to infections.  Neurological: Negative for  dizziness, memory loss and night sweats.  Hematological: Negative for bruising/bleeding tendency and swollen glands.  Psychiatric/Behavioral: Negative for depressed mood, confusion and sleep disturbance. The patient is not nervous/anxious.     PMFS History:  Patient Active Problem List   Diagnosis Date Noted  . Status post left cataract extraction 01/12/2017  . Primary osteoarthritis of right knee 01/12/2017  . History of anxiety 01/12/2017  . History of depression 01/12/2017  . Dislocated intraocular lens 04/23/2016  . Degenerative myopia with choroidal neovascularization, right eye 12/05/2015  . Combined forms of age-related cataract of both eyes 12/05/2015  . Polyarthralgia 08/23/2015  . Myopia of both eyes 01/10/2014  . Family history of ovarian cancer 12/19/2013  . H/O amblyopia 05/10/2013  . Nuclear sclerosis 05/10/2013  . H/O: vasectomy     Past Medical History:  Diagnosis Date  . Anxiety   . ASCUS (atypical squamous cells of undetermined significance) on Pap smear 01/2007   NEG HIGH RISK HPV--BENIGN CHANGES ON 11/2007 PAP  . Depression    in her 35's and off & on  . Myopic macular degeneration   . Osteoarthritis    Rt.knee    Family History  Problem Relation Age of Onset  . Cancer Mother 2       OVARIAN CANCER/1987  . Hypertension Mother   . Osteoporosis Mother   . Ovarian cancer Mother        deceased  . Rheum arthritis Paternal Grandmother    Past Surgical History:  Procedure Laterality Date  . BREAST CYST ASPIRATION    . CATARACT EXTRACTION Left 2018  . TONSILLECTOMY AND ADENOIDECTOMY     Social History  Social History Narrative  . Not on file     Objective: Vital Signs: BP 120/72 (BP Location: Left Arm, Patient Position: Sitting, Cuff Size: Normal)   Pulse 63   Resp 15   Ht 5' 8"  (1.727 m)   Wt 169 lb (76.7 kg)   LMP 04/26/2014   BMI 25.70 kg/m    Physical Exam  Constitutional: She is oriented to person, place, and time. She appears  well-developed and well-nourished.  HENT:  Head: Normocephalic and atraumatic.  Eyes: Conjunctivae and EOM are normal.  Neck: Normal range of motion.  Cardiovascular: Normal rate, regular rhythm, normal heart sounds and intact distal pulses.  Pulmonary/Chest: Effort normal and breath sounds normal.  Abdominal: Soft. Bowel sounds are normal.  Lymphadenopathy:    She has no cervical adenopathy.  Neurological: She is alert and oriented to person, place, and time.  Skin: Skin is warm and dry. Capillary refill takes less than 2 seconds. There is erythema.  Over malar region  Psychiatric: She has a normal mood and affect. Her behavior is normal.  Nursing note and vitals reviewed.    Musculoskeletal Exam: C-spine thoracic lumbar spine good range of motion.  Shoulder joints elbows joints wrist joint MCPs PIPs DIPs with good range of motion with no synovitis.  Hip joints knee joints ankles MTPs PIPs with good range of motion.  She has warmth and swelling in her right knee joint.  CDAI Exam: CDAI Homunculus Exam:   Tenderness:  RLE: tibiofemoral  Swelling:  RLE: tibiofemoral  Joint Counts:  CDAI Tender Joint count: 1 CDAI Swollen Joint count: 1  Global Assessments:  Patient Global Assessment: 3 Provider Global Assessment: 3  CDAI Calculated Score: 8    Investigation: No additional findings.  Component     Latest Ref Rng & Units 01/12/2017  C3 Complement     83 - 193 mg/dL 111  C4 Complement     15 - 57 mg/dL 25  ANA Pattern 1      HOMOGENEOUS (A)  ANA Titer 1     titer 1:160 (H)  Sed Rate     0 - 30 mm/h 2  RA Latex Turbid.     <12 IU/mL <45  Cyclic Citrullin Peptide Ab     UNITS <16  Anit Nuclear Antibody(ANA)     NEGATIVE POSITIVE (A)  Scleroderma (Scl-70) (ENA) Antibody, IgG     <1.0 NEG AI <1.0 NEG  Ribonucleic Protein(ENA) Antibody, IgG     <1.0 NEG AI <1.0 NEG  ENA SM Ab Ser-aCnc     <1.0 NEG AI <1.0 NEG  SSA (Ro) (ENA) Antibody, IgG     <1.0 NEG AI <1.0  NEG  SSB (La) (ENA) Antibody, IgG     <1.0 NEG AI <1.0 NEG  ds DNA Ab     IU/mL 6 (H)  Angiotensin-Converting Enzyme     9 - 67 U/L 30  HLA-B27 Antigen     Negative Negative  G-6PDH     7.0 - 20.5 U/g Hgb 16.8  Uric Acid, Serum     2.5 - 7.0 mg/dL 3.9   CBC Latest Ref Rng & Units 01/12/2017 08/23/2015 05/11/2014  WBC 3.8 - 10.8 Thousand/uL 4.7 3.9 5.3  Hemoglobin 11.7 - 15.5 g/dL 13.6 13.0 13.5  Hematocrit 35.0 - 45.0 % 39.4 38.7 39.7  Platelets 140 - 400 Thousand/uL 217 194 215   CMP Latest Ref Rng & Units 01/12/2017 08/23/2015  Glucose 65 - 99 mg/dL 92 95  BUN  7 - 25 mg/dL 19 13  Creatinine 0.50 - 1.05 mg/dL 0.86 0.79  Sodium 135 - 146 mmol/L 141 142  Potassium 3.5 - 5.3 mmol/L 4.4 4.5  Chloride 98 - 110 mmol/L 105 104  CO2 20 - 32 mmol/L 30 22  Calcium 8.6 - 10.4 mg/dL 9.2 8.6  Total Protein 6.1 - 8.1 g/dL 6.1 6.2  Total Bilirubin 0.2 - 1.2 mg/dL 0.4 0.5  Alkaline Phos 33 - 130 U/L - 48  AST 10 - 35 U/L 16 20  ALT 6 - 29 U/L 18 18    Imaging: No results found.  Speciality Comments: No specialty comments available.    Procedures:  Large Joint Inj: R knee on 02/22/2017 12:09 PM Indications: pain Details: 27 G 1.5 in needle, medial approach  Arthrogram: No  Medications: 1.5 mL lidocaine (PF) 1 %; 40 mg triamcinolone acetonide 40 MG/ML Aspirate: 0 mL Outcome: tolerated well, no immediate complications Procedure, treatment alternatives, risks and benefits explained, specific risks discussed. Consent was given by the patient. Immediately prior to procedure a time out was called to verify the correct patient, procedure, equipment, support staff and site/side marked as required. Patient was prepped and draped in the usual sterile fashion.     Allergies: Patient has no known allergies.   Assessment / Plan:     Visit Diagnoses: Autoimmune disease ([place]) - Positive ANA 1: 160 NH, positive dsDNA, fatigue, Raynaud's phenomenon, malar rash and history of inflammatory arthritis.   We had detailed discussion regarding her current situation.  It is possible that she has early stages of autoimmune disease.  We discussed possible trial of Plaquenil and to see if that will help her symptoms.  Indication, side effects and contraindications of Plaquenil were discussed at length.  Handout on Plaquenil was given and consent was taken.  I have called in prescription for Plaquenil 200 mg p.o. twice daily.  She will have baseline eye exam and then eye exam on a yearly basis.  I will for also advised her to get pneumococcal vaccine.  Patient was counseled on the purpose, proper use, and adverse effects of hydroxychloroquine including nausea/diarrhea, skin rash, headaches, and sun sensitivity.  Discussed importance of annual eye exams while on hydroxychloroquine to monitor to ocular toxicity and discussed importance of frequent laboratory monitoring.  Provided patient with eye exam form for baseline ophthalmologic exam.  Provided patient with educational materials on hydroxychloroquine and answered all questions.  Patient consented to hydroxychloroquine.  Will upload consent in the media tab.     Raynaud's syndrome without gangrene: Protection from cold temperature was discussed.  Other fatigue: I would like to see response to Plaquenil.  Primary osteoarthritis of right knee - Previously had cortisone injections, visco supplementation, and PTSees Dr. Theda Sers.  She has increased pain swelling and some warmth in the right knee joint today.  After informed consent was obtained right knee joint was injected with cortisone as described above.  She tolerated the procedure well.  History of anxiety  History of depression  Status post left cataract extraction    Orders: Orders Placed This Encounter  Procedures  . Large Joint Inj   Meds ordered this encounter  Medications  . hydroxychloroquine (PLAQUENIL) 200 MG tablet    Sig: Take 1 tablet (200 mg total) by mouth 2 (two) times daily.     Dispense:  60 tablet    Refill:  3    Face-to-face time spent with patient was 30 minutes. Greater than 50% of time  was spent in counseling and coordination of care.  Follow-Up Instructions: Return in about 3 months (around 05/22/2017) for Autoimmune disease.   Bo Merino, MD  Note - This record has been created using Editor, commissioning.  Chart creation errors have been sought, but may not always  have been located. Such creation errors do not reflect on  the standard of medical care.

## 2017-02-22 ENCOUNTER — Encounter: Payer: Self-pay | Admitting: Rheumatology

## 2017-02-22 ENCOUNTER — Ambulatory Visit: Payer: Commercial Managed Care - PPO | Admitting: Rheumatology

## 2017-02-22 VITALS — BP 120/72 | HR 63 | Resp 15 | Ht 68.0 in | Wt 169.0 lb

## 2017-02-22 DIAGNOSIS — R5383 Other fatigue: Secondary | ICD-10-CM

## 2017-02-22 DIAGNOSIS — M1711 Unilateral primary osteoarthritis, right knee: Secondary | ICD-10-CM | POA: Diagnosis not present

## 2017-02-22 DIAGNOSIS — Z8659 Personal history of other mental and behavioral disorders: Secondary | ICD-10-CM | POA: Diagnosis not present

## 2017-02-22 DIAGNOSIS — M359 Systemic involvement of connective tissue, unspecified: Secondary | ICD-10-CM | POA: Insufficient documentation

## 2017-02-22 DIAGNOSIS — I7300 Raynaud's syndrome without gangrene: Secondary | ICD-10-CM | POA: Diagnosis not present

## 2017-02-22 DIAGNOSIS — Z9842 Cataract extraction status, left eye: Secondary | ICD-10-CM

## 2017-02-22 DIAGNOSIS — D8989 Other specified disorders involving the immune mechanism, not elsewhere classified: Secondary | ICD-10-CM | POA: Diagnosis not present

## 2017-02-22 MED ORDER — LIDOCAINE HCL (PF) 1 % IJ SOLN
1.5000 mL | INTRAMUSCULAR | Status: AC | PRN
  Administered 2017-02-22: 1.5 mL

## 2017-02-22 MED ORDER — HYDROXYCHLOROQUINE SULFATE 200 MG PO TABS: 200.0000 mg | ORAL_TABLET | Freq: Two times a day (BID) | ORAL | 3 refills | Status: DC

## 2017-02-22 MED ORDER — TRIAMCINOLONE ACETONIDE 40 MG/ML IJ SUSP
40.0000 mg | INTRAMUSCULAR | Status: AC | PRN
  Administered 2017-02-22: 40 mg via INTRA_ARTICULAR

## 2017-02-22 NOTE — Patient Instructions (Signed)
Hydroxychloroquine tablets What is this medicine? HYDROXYCHLOROQUINE (hye drox ee KLOR oh kwin) is used to treat rheumatoid arthritis and systemic lupus erythematosus. It is also used to treat malaria. This medicine may be used for other purposes; ask your health care provider or pharmacist if you have questions. COMMON BRAND NAME(S): Plaquenil, Quineprox What should I tell my health care provider before I take this medicine? They need to know if you have any of these conditions: -diabetes -eye disease, vision problems -G6PD deficiency -history of blood diseases -history of irregular heartbeat -if you often drink alcohol -kidney disease -liver disease -porphyria -psoriasis -seizures -an unusual or allergic reaction to chloroquine, hydroxychloroquine, other medicines, foods, dyes, or preservatives -pregnant or trying to get pregnant -breast-feeding How should I use this medicine? Take this medicine by mouth with a glass of water. Follow the directions on the prescription label. Avoid taking antacids within 4 hours of taking this medicine. It is best to separate these medicines by at least 4 hours. Do not cut, crush or chew this medicine. You can take it with or without food. If it upsets your stomach, take it with food. Take your medicine at regular intervals. Do not take your medicine more often than directed. Take all of your medicine as directed even if you think you are better. Do not skip doses or stop your medicine early. Talk to your pediatrician regarding the use of this medicine in children. While this drug may be prescribed for selected conditions, precautions do apply. Overdosage: If you think you have taken too much of this medicine contact a poison control center or emergency room at once. NOTE: This medicine is only for you. Do not share this medicine with others. What if I miss a dose? If you miss a dose, take it as soon as you can. If it is almost time for your next dose,  take only that dose. Do not take double or extra doses. What may interact with this medicine? Do not take this medicine with any of the following medications: -cisapride -dofetilide -dronedarone -live virus vaccines -penicillamine -pimozide -thioridazine -ziprasidone This medicine may also interact with the following medications: -ampicillin -antacids -cimetidine -cyclosporine -digoxin -medicines for diabetes, like insulin, glipizide, glyburide -medicines for seizures like carbamazepine, phenobarbital, phenytoin -mefloquine -methotrexate -other medicines that prolong the QT interval (cause an abnormal heart rhythm) -praziquantel This list may not describe all possible interactions. Give your health care provider a list of all the medicines, herbs, non-prescription drugs, or dietary supplements you use. Also tell them if you smoke, drink alcohol, or use illegal drugs. Some items may interact with your medicine. What should I watch for while using this medicine? Tell your doctor or healthcare professional if your symptoms do not start to get better or if they get worse. Avoid taking antacids within 4 hours of taking this medicine. It is best to separate these medicines by at least 4 hours. Tell your doctor or health care professional right away if you have any change in your eyesight. Your vision and blood may be tested before and during use of this medicine. This medicine can make you more sensitive to the sun. Keep out of the sun. If you cannot avoid being in the sun, wear protective clothing and use sunscreen. Do not use sun lamps or tanning beds/booths. What side effects may I notice from receiving this medicine? Side effects that you should report to your doctor or health care professional as soon as possible: -allergic reactions like skin rash,   itching or hives, swelling of the face, lips, or tongue -changes in vision -decreased hearing or ringing of the ears -redness,  blistering, peeling or loosening of the skin, including inside the mouth -seizures -sensitivity to light -signs and symptoms of a dangerous change in heartbeat or heart rhythm like chest pain; dizziness; fast or irregular heartbeat; palpitations; feeling faint or lightheaded, falls; breathing problems -signs and symptoms of liver injury like dark yellow or brown urine; general ill feeling or flu-like symptoms; light-colored stools; loss of appetite; nausea; right upper belly pain; unusually weak or tired; yellowing of the eyes or skin -signs and symptoms of low blood sugar such as feeling anxious; confusion; dizziness; increased hunger; unusually weak or tired; sweating; shakiness; cold; irritable; headache; blurred vision; fast heartbeat; loss of consciousness -uncontrollable head, mouth, neck, arm, or leg movements Side effects that usually do not require medical attention (report to your doctor or health care professional if they continue or are bothersome): -anxious -diarrhea -dizziness -hair loss -headache -irritable -loss of appetite -nausea, vomiting -stomach pain This list may not describe all possible side effects. Call your doctor for medical advice about side effects. You may report side effects to FDA at 1-800-FDA-1088. Where should I keep my medicine? Keep out of the reach of children. In children, this medicine can cause overdose with small doses. Store at room temperature between 15 and 30 degrees C (59 and 86 degrees F). Protect from moisture and light. Throw away any unused medicine after the expiration date. NOTE: This sheet is a summary. It may not cover all possible information. If you have questions about this medicine, talk to your doctor, pharmacist, or health care provider.  2018 Elsevier/Gold Standard (2015-08-07 14:16:15) Standing Labs We placed an order today for your standing lab work.    Please come back and get your standing labs in 1 month, 3 months and then  every 5 months  We have open lab Monday through Friday from 8:30-11:30 AM and 1:30-4 PM at the office of Dr. Pollyann SavoyShaili Giavana Rooke.   The office is located at [address], [address], [place], [place] [address] No appointment is necessary.   Labs are drawn by [facility].  You may receive a bill from [facility] for your lab work. If you have any questions regarding directions or hours of operation,  please call [phone].

## 2017-02-23 ENCOUNTER — Telehealth: Payer: Self-pay | Admitting: Rheumatology

## 2017-02-23 NOTE — Telephone Encounter (Signed)
Patient has a question about Plaquenil. Patient was reading about it, and wanted to bring to doctors attention she gets injections for macular degeneration. Please call to discuss.

## 2017-02-23 NOTE — Telephone Encounter (Signed)
She will have to ask her ophthalmologist.

## 2017-02-23 NOTE — Telephone Encounter (Signed)
Patient states she is actively under treatment for macular degeneration and is receiving injections in her eyes. Patient has been prescribed PLQ and wants to make sure that there is not a contraindication with this. Please advise.

## 2017-02-24 NOTE — Telephone Encounter (Signed)
Left message to advise patient she should follow up with opthalmologist regarding PLQ and injections she is receiving.

## 2017-03-09 ENCOUNTER — Telehealth: Payer: Self-pay | Admitting: Rheumatology

## 2017-03-09 NOTE — Telephone Encounter (Signed)
Patient called stating that Dr. Doretha Souraig Graven [facility] requested that she discontinue taking the Plaquenil due to having existing retinal disease.  Patient wants to know if there is something else she can take or go without the medicine altogether.  Patient also states her knee is feeling "100% better" after the cortisone injection.

## 2017-03-09 NOTE — Telephone Encounter (Signed)
I spoke with patient and discussed that the treatment options will be methotrexate or Imuran in case her symptoms flare again.  At this time she would stay off all the medications.  We will discuss other treatment options at the follow-up visit.

## 2017-04-22 DIAGNOSIS — T8522XA Displacement of intraocular lens, initial encounter: Secondary | ICD-10-CM | POA: Diagnosis not present

## 2017-04-22 DIAGNOSIS — Z8669 Personal history of other diseases of the nervous system and sense organs: Secondary | ICD-10-CM | POA: Diagnosis not present

## 2017-04-22 DIAGNOSIS — H442A1 Degenerative myopia with choroidal neovascularization, right eye: Secondary | ICD-10-CM | POA: Diagnosis not present

## 2017-05-20 NOTE — Progress Notes (Signed)
Office Visit Note  Patient: Brenda Nicholson             Date of Birth: Apr 18, 1960           MRN: [id number]             PCP: Rodrigo Ran, MD Referring: Rodrigo Ran, MD Visit Date: 06/02/2017 Occupation: @    Subjective:  Right knee pain   History of Present Illness: Brenda Nicholson is a 57 y.o. female with history of autoimmune disease, Raynaud's, and osteoarthritis.  She denies starting Plaquenil due to her ophthalmologist not recommending it.  She is currently getting injections in her right eye for treatment of myopic macular degeneration.  She would not like to start on Plaquenil.  She denies any recent flares of autoimmune disease.  She denies any sores in her mouth or nose.  Denies any recent rashes or photosensitivity.  Denies any swollen lymph nodes.  She denies any sicca symptoms.  She states that her symptoms of Raynolds have improved since summertime.  She denies any digital ulcerations.  She denies any recent low-grade fevers or increased fatigue.  She states that she has been having increased discomfort in her right knee.  She states she previously had a cortisone injection performed on 02/22/2017 which provided significant relief.  She states that she is having swelling and warmth in her right knee.  She states she occasionally limps due to the pain.  She states that she takes Aleve on a as needed basis especially if she is more active.  She states that she has been swimming for exercise.  She denies any using any ice, heat or elevation.  She has significant discomfort when climbing steps.  She denies any other joint pain or joint swelling at this time.    Activities of Daily Living:  Patient reports morning stiffness for 2  minutes.   Patient Denies nocturnal pain.  Difficulty dressing/grooming: Denies Difficulty climbing stairs: Reports Difficulty getting out of chair: Reports Difficulty using hands for taps, buttons, cutlery, and/or writing: Denies   Review of  Systems  Constitutional: Negative for fatigue.  HENT: Negative for mouth sores, mouth dryness and nose dryness.   Eyes: Negative for pain, visual disturbance and dryness.  Respiratory: Negative for cough, hemoptysis, shortness of breath and difficulty breathing.   Cardiovascular: Negative for chest pain, palpitations, hypertension and swelling in legs/feet.  Gastrointestinal: Negative for blood in stool, constipation and diarrhea.  Endocrine: Negative for increased urination.  Genitourinary: Negative for painful urination.  Musculoskeletal: Positive for arthralgias, joint pain, joint swelling and morning stiffness. Negative for myalgias, muscle weakness, muscle tenderness and myalgias.  Skin: Negative for color change, pallor, rash, hair loss, nodules/bumps, skin tightness, ulcers and sensitivity to sunlight.  Allergic/Immunologic: Negative for susceptible to infections.  Neurological: Negative for dizziness, numbness, headaches and weakness.  Hematological: Negative for swollen glands.  Psychiatric/Behavioral: Negative for depressed mood and sleep disturbance. The patient is not nervous/anxious.     PMFS History:  Patient Active Problem List   Diagnosis Date Noted  . Autoimmune disease (HCC) 02/22/2017  . Status post left cataract extraction 01/12/2017  . Primary osteoarthritis of right knee 01/12/2017  . History of anxiety 01/12/2017  . History of depression 01/12/2017  . Dislocated intraocular lens 04/23/2016  . Degenerative myopia with choroidal neovascularization, right eye 12/05/2015  . Combined forms of age-related cataract of both eyes 12/05/2015  . Polyarthralgia 08/23/2015  . Myopia of both eyes 01/10/2014  . Family history  of ovarian cancer 12/19/2013  . H/O amblyopia 05/10/2013  . Nuclear sclerosis 05/10/2013  . H/O: vasectomy     Past Medical History:  Diagnosis Date  . Anxiety   . ASCUS (atypical squamous cells of undetermined significance) on Pap smear 01/2007    NEG HIGH RISK HPV--BENIGN CHANGES ON 11/2007 PAP  . Depression    in her 76's and off & on  . Myopic macular degeneration   . Osteoarthritis    Rt.knee    Family History  Problem Relation Age of Onset  . Cancer Mother 41       OVARIAN CANCER/1987  . Hypertension Mother   . Osteoporosis Mother   . Ovarian cancer Mother        deceased  . Rheum arthritis Paternal Grandmother    Past Surgical History:  Procedure Laterality Date  . BREAST CYST ASPIRATION    . CATARACT EXTRACTION Left 2018  . TONSILLECTOMY AND ADENOIDECTOMY     Social History   Social History Narrative  . Not on file     Objective: Vital Signs: BP 113/65 (BP Location: Right Arm, Patient Position: Sitting, Cuff Size: Normal)   Pulse 79   Resp 16   Ht  (1.727 m)   Wt 167 lb (75.8 kg)   LMP 04/26/2014   BMI 25.39 kg/m    Physical Exam  Constitutional: She is oriented to person, place, and time. She appears well-developed and well-nourished.  HENT:  Head: Normocephalic and atraumatic.  Eyes: Conjunctivae and EOM are normal.  Neck: Normal range of motion.  Cardiovascular: Normal rate, regular rhythm, normal heart sounds and intact distal pulses.  Pulmonary/Chest: Effort normal and breath sounds normal.  Abdominal: Soft. Bowel sounds are normal.  Lymphadenopathy:    She has no cervical adenopathy.  Neurological: She is alert and oriented to person, place, and time.  Skin: Skin is warm and dry. Capillary refill takes less than 2 seconds.  Psychiatric: She has a normal mood and affect. Her behavior is normal.  Nursing note and vitals reviewed.    Musculoskeletal Exam: C-spine, thoracic spine, lumbar spine good range of motion.  No midline spinal tenderness.  No SI joint tenderness.  Shoulder joints, elbow joints, wrist joints, MCPs, PIPs, DIPs good range of motion with no synovitis.  Hip joints, ankle joints, MTPs, PIPs, DIPs good range of motion no synovitis.  She has limited range of motion of her  right knee especially with extension.  She has bilateral knee crepitus.  She has warmth and a small effusion of her right knee.  Left knee good range of motion with no warmth or effusion.  No tenderness of trochanteric bursa.  CDAI Exam: No CDAI exam completed.    Investigation: No additional findings. CBC Latest Ref Rng & Units 01/12/2017 08/23/2015 05/11/2014  WBC 3.8 - 10.8 Thousand/uL 4.7 3.9 5.3  Hemoglobin 11.7 - 15.5 g/dL 95.6 21.3 08.6  Hematocrit 35.0 - 45.0 % 39.4 38.7 39.7  Platelets 140 - 400 Thousand/uL 217 194 215   CMP Latest Ref Rng & Units 01/12/2017 08/23/2015  Glucose 65 - 99 mg/dL 92 95  BUN 7 - 25 mg/dL 19 13  Creatinine 5.78 - 1.05 mg/dL 4.69 6.29  Sodium 528 - 146 mmol/L 141 142  Potassium 3.5 - 5.3 mmol/L 4.4 4.5  Chloride 98 - 110 mmol/L 105 104  CO2 20 - 32 mmol/L 30 22  Calcium 8.6 - 10.4 mg/dL 9.2 8.6  Total Protein 6.1 - 8.1 g/dL 6.1 6.2  Total Bilirubin 0.2 - 1.2 mg/dL 0.4 0.5  Alkaline Phos 33 - 130 U/L - 48  AST 10 - 35 U/L 16 20  ALT 6 - 29 U/L 18 18    Imaging: No results found.  Speciality Comments: No specialty comments available.    Procedures:  Large Joint Inj: R knee on 06/02/2017 3:13 PM Indications: pain Details: 27 G 1.5 in needle, medial approach  Arthrogram: No  Medications: 1.5 mL lidocaine 1 %; 40 mg triamcinolone acetonide 40 MG/ML Aspirate: 0 mL Outcome: tolerated well, no immediate complications Procedure, treatment alternatives, risks and benefits explained, specific risks discussed. Consent was given by the patient. Immediately prior to procedure a time out was called to verify the correct patient, procedure, equipment, support staff and site/side marked as required. Patient was prepped and draped in the usual sterile fashion.     Allergies: Patient has no known allergies.   Assessment / Plan:     Visit Diagnoses: Autoimmune disease (HCC) - Positive ANA 1: 160 NH, positive dsDNA, fatigue, Raynaud's phenomenon, malar rash  and history of inflammatory arthritis: She has not had any recent autoimmune flares.  She never started on PLQ due to her ophthalmologist not recommending it due to her undergoing right eye injections for myopic macular degeneration.  She does not feel as though the benefits outweigh the risks.  She has no malar rash or other lesions at this time.  Her ray nods has improved since summertime.  She has no signs of digital ulcerations.  She has no oral or nasal ulcerations on exam today.  No parotid swelling.  No cervical lymphadenopathy.  She has not had any recent low-grade fevers or worsening fatigue.  No palpitations or shortness of breath.  She presents today with right knee pain.  At her last visit on 02/22/2017 she had a cortisone injection which provided significant relief.  She now has warmth and small effusion of her right knee.  She is requesting a cortisone injection today in the office.  She has been limping due to the pain and has limited extension.  She has been taking Aleve as needed if she has increased activity.  We discussed potentially starting her on methotrexate but if she does not want to at this time.  We are going to repeat her autoimmune labs through [facility].  The paperwork was filled out today in the office.  Raynaud's syndrome without gangrene: Improving.  No signs of digital ulcerations or gangrene.  Primary osteoarthritis of right knee: She has warmth and a small effusion of her right knee.  She has limited extension with discomfort.  She has right knee crepitus.  She is been having increased discomfort for several weeks.  She did cortisone injection on 02/22/2017 which provided significant relief.  She would like to apply for Visco supplementation for her right knee as well.  She was given a handout for knee exercises that she can perform at home.  She was given a prescription for Voltaren gel.  Potential side effects were discussed.  We also discussed trying a knee brace which she  will get over-the-counter.  Other fatigue: Improved.   Screening for osteoporosis: Patient has never had a DEXA scan.  I ordered a DEXA scan today.  I advised her to increase her dietary intake of calcium.  She can also start taking a maintenance dose of vitamin D at 12,000 units daily.  She is can have her PCP check her vitamin D level at her next  visit.   Other medical conditions are listed as follows:  Status post left cataract extraction  History of anxiety  History of depression    Orders: Orders Placed This Encounter  Procedures  . Large Joint Inj   No orders of the defined types were placed in this encounter.   Face-to-face time spent with patient was 30 minutes. >50% of time was spent in counseling and coordination of care.  Follow-Up Instructions: Return in about 6 months (around 12/03/2017) for Autoimmune Disease, Osteoarthritis.   Gearldine Bienenstock, PA-C   I examined and evaluated the patient with Sherron Ales PA.  Patient had good response to initial cortisone injection to her right knee joint.  She has recurrence of warmth and swelling in her right knee.  I injected her right knee with the cortisone.  She continues to have recurrence of swelling we may have to consider other immunosuppressive agents. The plan of care was discussed as noted above.  Pollyann Savoy, MD  Note - This record has been created using Animal nutritionist.  Chart creation errors have been sought, but may not always  have been located. Such creation errors do not reflect on  the standard of medical care.

## 2017-06-02 ENCOUNTER — Encounter: Payer: Self-pay | Admitting: Rheumatology

## 2017-06-02 ENCOUNTER — Ambulatory Visit: Payer: Commercial Managed Care - PPO | Admitting: Rheumatology

## 2017-06-02 VITALS — BP 113/65 | HR 79 | Resp 16 | Ht 68.0 in | Wt 167.0 lb

## 2017-06-02 DIAGNOSIS — D8989 Other specified disorders involving the immune mechanism, not elsewhere classified: Secondary | ICD-10-CM

## 2017-06-02 DIAGNOSIS — Z13820 Encounter for screening for osteoporosis: Secondary | ICD-10-CM

## 2017-06-02 DIAGNOSIS — Z9842 Cataract extraction status, left eye: Secondary | ICD-10-CM | POA: Diagnosis not present

## 2017-06-02 DIAGNOSIS — Z8659 Personal history of other mental and behavioral disorders: Secondary | ICD-10-CM

## 2017-06-02 DIAGNOSIS — I7300 Raynaud's syndrome without gangrene: Secondary | ICD-10-CM

## 2017-06-02 DIAGNOSIS — G8929 Other chronic pain: Secondary | ICD-10-CM | POA: Diagnosis not present

## 2017-06-02 DIAGNOSIS — M1711 Unilateral primary osteoarthritis, right knee: Secondary | ICD-10-CM | POA: Diagnosis not present

## 2017-06-02 DIAGNOSIS — M359 Systemic involvement of connective tissue, unspecified: Secondary | ICD-10-CM

## 2017-06-02 DIAGNOSIS — R5383 Other fatigue: Secondary | ICD-10-CM | POA: Diagnosis not present

## 2017-06-02 DIAGNOSIS — M25561 Pain in right knee: Secondary | ICD-10-CM

## 2017-06-02 MED ORDER — TRIAMCINOLONE ACETONIDE 40 MG/ML IJ SUSP
40.0000 mg | INTRAMUSCULAR | Status: AC | PRN
  Administered 2017-06-02: 40 mg via INTRA_ARTICULAR

## 2017-06-02 MED ORDER — LIDOCAINE HCL 1 % IJ SOLN
1.5000 mL | INTRAMUSCULAR | Status: AC | PRN
  Administered 2017-06-02: 1.5 mL

## 2017-06-02 MED ORDER — DICLOFENAC SODIUM 1 % TD GEL: TRANSDERMAL | 3 refills | Status: DC

## 2017-06-02 NOTE — Patient Instructions (Signed)

## 2017-06-08 ENCOUNTER — Telehealth (INDEPENDENT_AMBULATORY_CARE_PROVIDER_SITE_OTHER): Payer: Self-pay

## 2017-06-08 NOTE — Telephone Encounter (Signed)
Please Schedule 3 visits for Brenda Alesaylor Dale, PA.  Thank you.  Euflexxa Injection series, right knee Buy & Bill Covered at 100% Co-pay $35.00 each visit No PA required.

## 2017-06-15 NOTE — Telephone Encounter (Signed)
I LMOM for patient to call, and schedule euflexxa injections for Rt knee b/b with Ladona Ridgelaylor.

## 2017-07-13 ENCOUNTER — Ambulatory Visit (INDEPENDENT_AMBULATORY_CARE_PROVIDER_SITE_OTHER): Payer: Commercial Managed Care - PPO | Admitting: Physician Assistant

## 2017-07-13 DIAGNOSIS — M1711 Unilateral primary osteoarthritis, right knee: Secondary | ICD-10-CM

## 2017-07-13 MED ORDER — LIDOCAINE HCL 1 % IJ SOLN
1.5000 mL | INTRAMUSCULAR | Status: AC | PRN
  Administered 2017-07-13: 1.5 mL

## 2017-07-13 MED ORDER — SODIUM HYALURONATE (VISCOSUP) 20 MG/2ML IX SOSY
20.0000 mg | PREFILLED_SYRINGE | INTRA_ARTICULAR | Status: AC | PRN
  Administered 2017-07-13: 20 mg via INTRA_ARTICULAR

## 2017-07-13 NOTE — Progress Notes (Signed)
   Procedure Note  Patient: Brenda Nicholson             Date of Birth: 11/16/1960           MRN: [id number]             Visit Date: 07/13/2017  Procedures: Visit Diagnoses: Primary osteoarthritis of right knee   Euflexxa #1 Right knee joint injection  Large Joint Inj: R knee on 07/13/2017 10:33 AM Indications: pain Details: 27 G 1.5 in needle, medial approach  Arthrogram: No  Medications: 1.5 mL lidocaine 1 %; 20 mg Sodium Hyaluronate 20 MG/2ML Aspirate: 0 mL Outcome: tolerated well, no immediate complications Procedure, treatment alternatives, risks and benefits explained, specific risks discussed. Consent was given by the patient. Immediately prior to procedure a time out was called to verify the correct patient, procedure, equipment, support staff and site/side marked as required. Patient was prepped and draped in the usual sterile fashion.      Patient tolerated the procedure well.    Sherron Alesaylor Alverta Caccamo, PA-C

## 2017-07-20 ENCOUNTER — Ambulatory Visit (INDEPENDENT_AMBULATORY_CARE_PROVIDER_SITE_OTHER): Payer: Commercial Managed Care - PPO | Admitting: Physician Assistant

## 2017-07-20 DIAGNOSIS — M1711 Unilateral primary osteoarthritis, right knee: Secondary | ICD-10-CM | POA: Diagnosis not present

## 2017-07-20 MED ORDER — LIDOCAINE HCL 1 % IJ SOLN
1.5000 mL | INTRAMUSCULAR | Status: AC | PRN
  Administered 2017-07-20: 1.5 mL

## 2017-07-20 MED ORDER — SODIUM HYALURONATE (VISCOSUP) 20 MG/2ML IX SOSY
20.0000 mg | PREFILLED_SYRINGE | INTRA_ARTICULAR | Status: AC | PRN
  Administered 2017-07-20: 20 mg via INTRA_ARTICULAR

## 2017-07-20 NOTE — Progress Notes (Signed)
   Procedure Note  Patient: Brenda Nicholson             Date of Birth: 04/11/1960           MRN: [id number]             Visit Date: 07/20/2017  Procedures: Visit Diagnoses: Primary osteoarthritis of right knee Euflexxa #2 right knee, B/B Large Joint Inj: R knee on 07/20/2017 11:33 AM Indications: pain Details: 27 G 1.5 in needle, medial approach  Arthrogram: No  Medications: 20 mg Sodium Hyaluronate 20 MG/2ML; 1.5 mL lidocaine 1 % Aspirate: 0 mL Outcome: tolerated well, no immediate complications Procedure, treatment alternatives, risks and benefits explained, specific risks discussed. Consent was given by the patient. Immediately prior to procedure a time out was called to verify the correct patient, procedure, equipment, support staff and site/side marked as required. Patient was prepped and draped in the usual sterile fashion.     Patient tolerated the procedure well.   Sherron Alesaylor Rhianna Raulerson, PA-C

## 2017-07-27 ENCOUNTER — Ambulatory Visit (INDEPENDENT_AMBULATORY_CARE_PROVIDER_SITE_OTHER): Payer: Commercial Managed Care - PPO | Admitting: Physician Assistant

## 2017-07-27 DIAGNOSIS — M1711 Unilateral primary osteoarthritis, right knee: Secondary | ICD-10-CM | POA: Diagnosis not present

## 2017-07-27 MED ORDER — LIDOCAINE HCL 1 % IJ SOLN
1.5000 mL | INTRAMUSCULAR | Status: AC | PRN
  Administered 2017-07-27: 1.5 mL

## 2017-07-27 MED ORDER — SODIUM HYALURONATE (VISCOSUP) 20 MG/2ML IX SOSY
20.0000 mg | PREFILLED_SYRINGE | INTRA_ARTICULAR | Status: AC | PRN
  Administered 2017-07-27: 20 mg via INTRA_ARTICULAR

## 2017-07-27 NOTE — Progress Notes (Signed)
   Procedure Note  Patient: Brenda Nicholson             Date of Birth: 05/07/1960           MRN: [id number]             Visit Date: 07/27/2017  Procedures: Visit Diagnoses: Unilateral primary osteoarthritis, right knee Euflexxa #3 Right knee joint injection  Large Joint Inj: R knee on 07/27/2017 9:28 AM Indications: pain Details: 27 G 1.5 in needle, medial approach  Arthrogram: No  Medications: 1.5 mL lidocaine 1 %; 20 mg Sodium Hyaluronate 20 MG/2ML Aspirate: 0 mL Outcome: tolerated well, no immediate complications Procedure, treatment alternatives, risks and benefits explained, specific risks discussed. Consent was given by the patient. Immediately prior to procedure a time out was called to verify the correct patient, procedure, equipment, support staff and site/side marked as required. Patient was prepped and draped in the usual sterile fashion.     Patient tolerated the procedure well. Patient has already noticed improvement since starting the initial Euflexxa injection.   Sherron Alesaylor Jermaine Neuharth, PA-C

## 2017-08-31 DIAGNOSIS — Z23 Encounter for immunization: Secondary | ICD-10-CM | POA: Diagnosis not present

## 2017-09-02 DIAGNOSIS — Z8669 Personal history of other diseases of the nervous system and sense organs: Secondary | ICD-10-CM | POA: Diagnosis not present

## 2017-09-02 DIAGNOSIS — Z961 Presence of intraocular lens: Secondary | ICD-10-CM | POA: Diagnosis not present

## 2017-09-02 DIAGNOSIS — H442A1 Degenerative myopia with choroidal neovascularization, right eye: Secondary | ICD-10-CM | POA: Diagnosis not present

## 2017-10-12 ENCOUNTER — Encounter: Payer: Self-pay | Admitting: Physician Assistant

## 2017-10-12 ENCOUNTER — Ambulatory Visit: Payer: Commercial Managed Care - PPO | Admitting: Rheumatology

## 2017-10-12 VITALS — BP 129/73 | HR 73 | Resp 13 | Ht 68.0 in | Wt 161.0 lb

## 2017-10-12 DIAGNOSIS — Z8659 Personal history of other mental and behavioral disorders: Secondary | ICD-10-CM

## 2017-10-12 DIAGNOSIS — Z9842 Cataract extraction status, left eye: Secondary | ICD-10-CM | POA: Diagnosis not present

## 2017-10-12 DIAGNOSIS — R5383 Other fatigue: Secondary | ICD-10-CM | POA: Diagnosis not present

## 2017-10-12 DIAGNOSIS — I7300 Raynaud's syndrome without gangrene: Secondary | ICD-10-CM

## 2017-10-12 DIAGNOSIS — M1711 Unilateral primary osteoarthritis, right knee: Secondary | ICD-10-CM | POA: Diagnosis not present

## 2017-10-12 DIAGNOSIS — M359 Systemic involvement of connective tissue, unspecified: Secondary | ICD-10-CM | POA: Diagnosis not present

## 2017-10-12 MED ORDER — TRIAMCINOLONE ACETONIDE 40 MG/ML IJ SUSP
40.0000 mg | INTRAMUSCULAR | Status: AC | PRN
  Administered 2017-10-12: 40 mg via INTRA_ARTICULAR

## 2017-10-12 MED ORDER — LIDOCAINE HCL 1 % IJ SOLN
1.5000 mL | INTRAMUSCULAR | Status: AC | PRN
  Administered 2017-10-12: 1.5 mL

## 2017-10-12 NOTE — Progress Notes (Signed)
Office Visit Note  Patient: Brenda Nicholson             Date of Birth: December 07, 1960           MRN: [id number]             PCP: Rodrigo Ran, MD Referring: Rodrigo Ran, MD Visit Date: 10/12/2017 Occupation: @GUAROCC @  Subjective:  Pain and swelling in right knee.   History of Present Illness: Brenda Nicholson is a 57 y.o. female with history of autoimmune disease and osteoarthritis.  She states she had been doing well until last week when she suddenly developed pain and swelling in her right knee joint to the point she was having difficulty walking.  She iced her knee and use some topical anti-inflammatories and the swelling is better now.  She had good relief from Visco supplement injections which she had in July.  She has been exercising on a regular basis.  Activities of Daily Living:  Patient reports morning stiffness for 0 minute.   Patient Denies nocturnal pain.  Difficulty dressing/grooming: Denies Difficulty climbing stairs: Reports Difficulty getting out of chair: Reports Difficulty using hands for taps, buttons, cutlery, and/or writing: Denies  Review of Systems  Constitutional: Negative for fatigue, night sweats, weight gain and weight loss.  HENT: Negative for mouth sores, trouble swallowing, trouble swallowing, mouth dryness and nose dryness.   Eyes: Negative for pain, redness, visual disturbance and dryness.  Respiratory: Negative for cough, shortness of breath and difficulty breathing.   Cardiovascular: Negative for chest pain, palpitations, hypertension, irregular heartbeat and swelling in legs/feet.  Gastrointestinal: Negative for blood in stool, constipation and diarrhea.  Endocrine: Negative for increased urination.  Genitourinary: Negative for vaginal dryness.  Musculoskeletal: Positive for arthralgias, joint pain, joint swelling and morning stiffness. Negative for myalgias, muscle weakness, muscle tenderness and myalgias.  Skin: Positive for color change and  rash. Negative for hair loss, skin tightness, ulcers and sensitivity to sunlight.  Allergic/Immunologic: Negative for susceptible to infections.  Neurological: Negative for dizziness, memory loss, night sweats and weakness.  Hematological: Negative for swollen glands.  Psychiatric/Behavioral: Negative for depressed mood and sleep disturbance. The patient is not nervous/anxious.     PMFS History:  Patient Active Problem List   Diagnosis Date Noted  . Autoimmune disease (HCC) 02/22/2017  . Status post left cataract extraction 01/12/2017  . Primary osteoarthritis of right knee 01/12/2017  . History of anxiety 01/12/2017  . History of depression 01/12/2017  . Dislocated intraocular lens 04/23/2016  . Degenerative myopia with choroidal neovascularization, right eye 12/05/2015  . Combined forms of age-related cataract of both eyes 12/05/2015  . Polyarthralgia 08/23/2015  . Myopia of both eyes 01/10/2014  . Family history of ovarian cancer 12/19/2013  . H/O amblyopia 05/10/2013  . Nuclear sclerosis 05/10/2013  . H/O: vasectomy     Past Medical History:  Diagnosis Date  . Anxiety   . ASCUS (atypical squamous cells of undetermined significance) on Pap smear 01/2007   NEG HIGH RISK HPV--BENIGN CHANGES ON 11/2007 PAP  . Depression    in her 35's and off & on  . Myopic macular degeneration   . Osteoarthritis    Rt.knee    Family History  Problem Relation Age of Onset  . Cancer Mother 43       OVARIAN CANCER/1987  . Hypertension Mother   . Osteoporosis Mother   . Ovarian cancer Mother        deceased  . Rheum arthritis Paternal Grandmother  Past Surgical History:  Procedure Laterality Date  . BREAST CYST ASPIRATION    . CATARACT EXTRACTION Left 2018  . TONSILLECTOMY AND ADENOIDECTOMY     Social History   Social History Narrative  . Not on file    Objective: Vital Signs: BP 129/73 (BP Location: Left Arm, Patient Position: Sitting, Cuff Size: Normal)   Pulse 73   Resp  13   Ht 5\' 8"  (1.727 m)   Wt 161 lb (73 kg)   LMP 04/26/2014   BMI 24.48 kg/m    Physical Exam  Constitutional: She is oriented to person, place, and time. She appears well-developed and well-nourished.  HENT:  Head: Normocephalic and atraumatic.  Eyes: Conjunctivae and EOM are normal.  Neck: Normal range of motion.  Cardiovascular: Normal rate, regular rhythm, normal heart sounds and intact distal pulses.  Pulmonary/Chest: Effort normal and breath sounds normal.  Abdominal: Soft. Bowel sounds are normal.  Lymphadenopathy:    She has no cervical adenopathy.  Neurological: She is alert and oriented to person, place, and time.  Skin: Skin is warm and dry. Capillary refill takes less than 2 seconds.  Erythema noted on bilateral cheeks  Psychiatric: She has a normal mood and affect. Her behavior is normal.  Nursing note and vitals reviewed.    Musculoskeletal Exam: C-spine thoracic lumbar spine good range of motion.  Shoulder joints elbow joints wrist joints MCPs PIPs DIPs were in good range of motion.  She has right knee joint valgus deformity with thickening of the knee joint.  All the joints are full range of motion with no synovitis.  CDAI Exam: CDAI Score: Not documented Patient Global Assessment: Not documented; Provider Global Assessment: Not documented Swollen: Not documented; Tender: Not documented Joint Exam   Not documented   There is currently no information documented on the homunculus. Go to the Rheumatology activity and complete the homunculus joint exam.  Investigation: No additional findings.  Imaging: No results found.  Recent Labs: Lab Results  Component Value Date   WBC 4.7 01/12/2017   HGB 13.6 01/12/2017   PLT 217 01/12/2017   NA 141 01/12/2017   K 4.4 01/12/2017   CL 105 01/12/2017   CO2 30 01/12/2017   GLUCOSE 92 01/12/2017   BUN 19 01/12/2017   CREATININE 0.86 01/12/2017   BILITOT 0.4 01/12/2017   ALKPHOS 48 08/23/2015   AST 16 01/12/2017    ALT 18 01/12/2017   PROT 6.1 01/12/2017   ALBUMIN 4.3 08/23/2015   CALCIUM 9.2 01/12/2017   GFRAA 86 01/12/2017    Speciality Comments: No specialty comments available.  Procedures:  Large Joint Inj: R knee on 10/12/2017 3:17 PM Indications: pain Details: 27 G 1.5 in needle, medial approach  Arthrogram: No  Medications: 40 mg triamcinolone acetonide 40 MG/ML; 1.5 mL lidocaine 1 % Aspirate: 0 mL Outcome: tolerated well, no immediate complications Procedure, treatment alternatives, risks and benefits explained, specific risks discussed. Consent was given by the patient. Immediately prior to procedure a time out was called to verify the correct patient, procedure, equipment, support staff and site/side marked as required. Patient was prepped and draped in the usual sterile fashion.     Allergies: Patient has no known allergies.   Assessment / Plan:     Visit Diagnoses: Autoimmune disease (HCC) - ANA 1:160 NH, +dsDNA, fatigue, malar rash, Raynaud's, inflammatory arthritis.  She continues to have some inflammation in her right knee joint.  She had a flare last week.  She has some facial  erythema.  She also has Raynaud's during the wintertime.  None of the other joints are swollen.  I will obtain [facility] labs today.  PLQ not recommended-patient reports history of retinal bleed., myopic MD  Raynaud's syndrome without gangrene-currently not active.  She has good capillary refill.  Other fatigue-resolved.  Unilateral primary osteoarthritis, right knee -status post visco July 2019.  She had pain and discomfort in her right knee joint.  She is having difficulty walking.  After different treatment options were discussed and side effects were reviewed the right knee joint was injected with cortisone as described above.  She was also given a prescription for a sleeve brace.  We had detailed discussion regarding total knee replacement.  She will let us know if she needs a referral.  Plan: Large  Joint Inj: R knee  Status post left cataract extraction  History of depression  History of anxiety   Orders: Orders Placed This Encounter  Procedures  . Large Joint Inj: R knee   No orders of the defined types were placed in this encounter.     Follow-Up Instructions: Return in about 6 months (around 04/13/2018) for Osteoarthritis, AD.   Pollyann Savoy, MD  Note - This record has been created using Animal nutritionist.  Chart creation errors have been sought, but may not always  have been located. Such creation errors do not reflect on  the standard of medical care.

## 2017-11-05 DIAGNOSIS — H524 Presbyopia: Secondary | ICD-10-CM | POA: Diagnosis not present

## 2017-11-05 DIAGNOSIS — Z0100 Encounter for examination of eyes and vision without abnormal findings: Secondary | ICD-10-CM | POA: Diagnosis not present

## 2017-11-05 DIAGNOSIS — H52203 Unspecified astigmatism, bilateral: Secondary | ICD-10-CM | POA: Diagnosis not present

## 2017-11-05 DIAGNOSIS — H5213 Myopia, bilateral: Secondary | ICD-10-CM | POA: Diagnosis not present

## 2017-11-18 DIAGNOSIS — Z0100 Encounter for examination of eyes and vision without abnormal findings: Secondary | ICD-10-CM | POA: Diagnosis not present

## 2017-12-07 ENCOUNTER — Ambulatory Visit: Payer: Commercial Managed Care - PPO | Admitting: Physician Assistant

## 2017-12-13 NOTE — Progress Notes (Signed)
Office Visit Note  Patient: Brenda Nicholson             Date of Birth: 1960-05-13           MRN: [id number]             PCP: Rodrigo Ran, MD Referring: Rodrigo Ran, MD Visit Date: 12/14/2017 Occupation: @GUAROCC @  Subjective:  Pain in left knee.   History of Present Illness: Brenda Nicholson is a 57 y.o. female history of autoimmune disease and osteoarthritis.  She states after the cortisone injection to her right knee joint her right knee joint has been feeling better.  She also had Visco supplement injections to her right knee joint in July.  She states about a month ago while she was walking her dog, another dog bumped into the side of her left knee.  She heard a sudden pop and developed pain and discomfort in her left knee.  She states now she has been having increased pain and swelling in her knee.  She is also noticed a cyst behind her left knee.  She has been working out and going to [employer] on a regular basis.  The other joints are painful.  Raynaud's is not active.  She has not noticed any recent malar rash.  Activities of Daily Living:  Patient reports morning stiffness for 0 minute.   Patient Denies nocturnal pain.  Difficulty dressing/grooming: Denies Difficulty climbing stairs: Reports Difficulty getting out of chair: Reports Difficulty using hands for taps, buttons, cutlery, and/or writing: Denies  Review of Systems  Constitutional: Negative for fatigue, night sweats, weight gain and weight loss.  HENT: Negative for mouth sores, trouble swallowing, trouble swallowing, mouth dryness and nose dryness.   Eyes: Negative for pain, redness, visual disturbance and dryness.  Respiratory: Negative for cough, shortness of breath and difficulty breathing.   Cardiovascular: Negative for chest pain, palpitations, hypertension, irregular heartbeat and swelling in legs/feet.  Gastrointestinal: Negative for blood in stool, constipation and diarrhea.  Endocrine: Negative for increased  urination.  Genitourinary: Negative for vaginal dryness.  Musculoskeletal: Positive for arthralgias, joint pain, joint swelling and morning stiffness. Negative for myalgias, muscle weakness, muscle tenderness and myalgias.  Skin: Negative for color change, rash, hair loss, skin tightness, ulcers and sensitivity to sunlight.  Allergic/Immunologic: Negative for susceptible to infections.  Neurological: Negative for dizziness, memory loss, night sweats and weakness.  Hematological: Negative for swollen glands.  Psychiatric/Behavioral: Negative for depressed mood and sleep disturbance. The patient is not nervous/anxious.     PMFS History:  Patient Active Problem List   Diagnosis Date Noted  . Autoimmune disease (HCC) 02/22/2017  . Status post left cataract extraction 01/12/2017  . Primary osteoarthritis of right knee 01/12/2017  . History of anxiety 01/12/2017  . History of depression 01/12/2017  . Dislocated intraocular lens 04/23/2016  . Degenerative myopia with choroidal neovascularization, right eye 12/05/2015  . Combined forms of age-related cataract of both eyes 12/05/2015  . Polyarthralgia 08/23/2015  . Myopia of both eyes 01/10/2014  . Family history of ovarian cancer 12/19/2013  . H/O amblyopia 05/10/2013  . Nuclear sclerosis 05/10/2013  . H/O: vasectomy     Past Medical History:  Diagnosis Date  . Anxiety   . ASCUS (atypical squamous cells of undetermined significance) on Pap smear 01/2007   NEG HIGH RISK HPV--BENIGN CHANGES ON 11/2007 PAP  . Depression    in her 43's and off & on  . Myopic macular degeneration   . Osteoarthritis  Rt.knee    Family History  Problem Relation Age of Onset  . Cancer Mother [id number]       OVARIAN CANCER/1987  . Hypertension Mother   . Osteoporosis Mother   . Ovarian cancer Mother        deceased  . Rheum arthritis Paternal Grandmother    Past Surgical History:  Procedure Laterality Date  . BREAST CYST ASPIRATION    . CATARACT  EXTRACTION Left 2018  . TONSILLECTOMY AND ADENOIDECTOMY     Social History   Social History Narrative  . Not on file    Objective: Vital Signs: BP 126/74 (BP Location: Right Arm, Patient Position: Sitting, Cuff Size: Normal)   Pulse 70   Resp 13   Ht 5\' 8"  (1.727 m)   Wt 156 lb (70.8 kg)   LMP 04/26/2014   BMI 23.72 kg/m    Physical Exam  Constitutional: She is oriented to person, place, and time. She appears well-developed and well-nourished.  HENT:  Head: Normocephalic and atraumatic.  Eyes: Conjunctivae and EOM are normal.  Neck: Normal range of motion.  Cardiovascular: Normal rate, regular rhythm, normal heart sounds and intact distal pulses.  Pulmonary/Chest: Effort normal and breath sounds normal.  Abdominal: Soft. Bowel sounds are normal.  Lymphadenopathy:    She has no cervical adenopathy.  Neurological: She is alert and oriented to person, place, and time.  Skin: Skin is warm and dry. Capillary refill takes less than 2 seconds.  Psychiatric: She has a normal mood and affect. Her behavior is normal.  Nursing note and vitals reviewed.    Musculoskeletal Exam: C-spine thoracic lumbar spine good range of motion.  Shoulder joints elbow joints wrist joint MCPs PIPs DIPs been good range of motion.  Hip joints are in good range of motion.  She had no warmth swelling or effusion in her right knee.  Left knee joint has mild effusion warmth and swelling.  Ankle joints MTPs PIPs were in good range of motion with no synovitis.  CDAI Exam: CDAI Score: Not documented Patient Global Assessment: Not documented; Provider Global Assessment: Not documented Swollen: Not documented; Tender: Not documented Joint Exam   Not documented   There is currently no information documented on the homunculus. Go to the Rheumatology activity and complete the homunculus joint exam.  Investigation: No additional findings.  Imaging: No results found.  Recent Labs: Lab Results  Component  Value Date   WBC 4.7 01/12/2017   HGB 13.6 01/12/2017   PLT 217 01/12/2017   NA 141 01/12/2017   K 4.4 01/12/2017   CL 105 01/12/2017   CO2 30 01/12/2017   GLUCOSE 92 01/12/2017   BUN 19 01/12/2017   CREATININE 0.86 01/12/2017   BILITOT 0.4 01/12/2017   ALKPHOS 48 08/23/2015   AST 16 01/12/2017   ALT 18 01/12/2017   PROT 6.1 01/12/2017   ALBUMIN 4.3 08/23/2015   CALCIUM 9.2 01/12/2017   GFRAA 86 01/12/2017    Speciality Comments: No specialty comments available.  Procedures:  Large Joint Inj: L knee on 12/14/2017 9:39 AM Indications: pain Details: 27 G 1.5 in needle, medial approach  Arthrogram: No  Medications: 40 mg triamcinolone acetonide 40 MG/ML; 3 mL lidocaine 1 % Aspirate: 0 mL Outcome: tolerated well, no immediate complications Procedure, treatment alternatives, risks and benefits explained, specific risks discussed. Consent was given by the patient. Immediately prior to procedure a time out was called to verify the correct patient, procedure, equipment, support staff and site/side marked as required.  Patient was prepped and draped in the usual sterile fashion.     Allergies: Patient has no known allergies.   Assessment / Plan:     Visit Diagnoses: Autoimmune disease (HCC) - ANA 1:160 NH, +dsDNA, fatigue, malar rash, Raynaud's, inflammatory arthritis. PLQ not recommended-patient reports history of retinal bleed., myopic MD.  Patient states she has not have any recent problems with Raynolds or malar rash.  She relates the pain and inflammation in her left knee due to recent injury.  She is supposed to have follow-up labs which she has not done so far.  She states she will get the [facility] labs today.  She did not want to take any disease modifying agents.  Acute pain of left knee-patient had warmth swelling and small effusion in her left knee.  The symptoms started after the injury about a month ago.  Today after informed consent was obtained left knee joint was injected  with cortisone.  Have advised her if his symptoms do not improve she should contact us to repeat for internal derangement on MRI.  Unilateral primary osteoarthritis, right knee - status post visco July 2019.  She also had cortisone injection in October 2019.  Other fatigue-improved  Raynaud's syndrome without gangrene-currently not active  Status post left cataract extraction  History of anxiety  History of depression   Orders: Orders Placed This Encounter  Procedures  . Large Joint Inj   No orders of the defined types were placed in this encounter.   Face-to-face time spent with patient was 30 minutes. Greater than 50% of time was spent in counseling and coordination of care.  Follow-Up Instructions: Return for Autoimmune disease, Osteoarthritis.  Patient has appointment in April.   Pollyann Savoy, MD  Note - This record has been created using Animal nutritionist.  Chart creation errors have been sought, but may not always  have been located. Such creation errors do not reflect on  the standard of medical care.

## 2017-12-14 ENCOUNTER — Encounter: Payer: Self-pay | Admitting: Rheumatology

## 2017-12-14 ENCOUNTER — Ambulatory Visit: Payer: Commercial Managed Care - PPO | Admitting: Rheumatology

## 2017-12-14 VITALS — BP 126/74 | HR 70 | Resp 13 | Ht 68.0 in | Wt 156.0 lb

## 2017-12-14 DIAGNOSIS — Z9842 Cataract extraction status, left eye: Secondary | ICD-10-CM | POA: Diagnosis not present

## 2017-12-14 DIAGNOSIS — M359 Systemic involvement of connective tissue, unspecified: Secondary | ICD-10-CM

## 2017-12-14 DIAGNOSIS — M25562 Pain in left knee: Secondary | ICD-10-CM

## 2017-12-14 DIAGNOSIS — R5383 Other fatigue: Secondary | ICD-10-CM | POA: Diagnosis not present

## 2017-12-14 DIAGNOSIS — M1711 Unilateral primary osteoarthritis, right knee: Secondary | ICD-10-CM

## 2017-12-14 DIAGNOSIS — Z8659 Personal history of other mental and behavioral disorders: Secondary | ICD-10-CM

## 2017-12-14 DIAGNOSIS — I7300 Raynaud's syndrome without gangrene: Secondary | ICD-10-CM | POA: Diagnosis not present

## 2017-12-14 MED ORDER — TRIAMCINOLONE ACETONIDE 40 MG/ML IJ SUSP
40.0000 mg | INTRAMUSCULAR | Status: AC | PRN
  Administered 2017-12-14: 40 mg via INTRA_ARTICULAR

## 2017-12-14 MED ORDER — LIDOCAINE HCL 1 % IJ SOLN
3.0000 mL | INTRAMUSCULAR | Status: AC | PRN
  Administered 2017-12-14: 3 mL

## 2018-01-12 ENCOUNTER — Telehealth: Payer: Self-pay | Admitting: *Deleted

## 2018-01-12 NOTE — Telephone Encounter (Signed)
Patient advised Bone density shows Osteopenia  T-Score -1.8   Recommendations Calcium, Vitamin D and resistive exercises. Repeat in 2 years .

## 2018-01-12 NOTE — Telephone Encounter (Signed)
Bone density shows Osteopenia  T-Score -1.8   Recommendations Calcium, Vitamin D and resistive exercises. Repeat in 2 years .

## 2018-02-18 ENCOUNTER — Other Ambulatory Visit: Payer: Self-pay

## 2018-02-18 ENCOUNTER — Encounter: Payer: Self-pay | Admitting: Obstetrics & Gynecology

## 2018-02-18 ENCOUNTER — Ambulatory Visit (INDEPENDENT_AMBULATORY_CARE_PROVIDER_SITE_OTHER): Payer: 59 | Admitting: Obstetrics & Gynecology

## 2018-02-18 VITALS — BP 136/80 | HR 76 | Resp 16 | Ht 67.5 in | Wt 151.2 lb

## 2018-02-18 DIAGNOSIS — Z01419 Encounter for gynecological examination (general) (routine) without abnormal findings: Secondary | ICD-10-CM | POA: Diagnosis not present

## 2018-02-18 DIAGNOSIS — N841 Polyp of cervix uteri: Secondary | ICD-10-CM

## 2018-02-18 MED ORDER — FLUOXETINE HCL 20 MG PO CAPS: 20.0000 mg | ORAL_CAPSULE | Freq: Every day | ORAL | 4 refills | Status: DC

## 2018-02-18 NOTE — Progress Notes (Signed)
58 y.o. G3P3 Married White or Caucasian female here for annual exam.  Is seeing Dr. Corliss Skains.  Has done significant blood work and she does not have a definitive diagnosis.  Pt did work on being very regular with her exercise.  She has lost 20 pounds and does feel better.  Has received some knee injections as well.  She has seen Dr. Despina Hick as well.    Denies vaginal bleeding.    Patient's last menstrual period was 04/26/2014.          Sexually active: Yes.    The current method of family planning is post menopausal status.    Exercising: Yes.    swim, weights Smoker:  no  Health Maintenance: Pap:  12/04/16 Neg. HR HPV:neg   05/11/14 neg  History of abnormal Pap:  no MMG:  01/04/17 BIRADS1:neg.  She is aware this is due.  She will scheduled Colonoscopy:  2016 f/u 10 years  BMD:   01/11/18 Osteopenia  TDaP:  2013 Pneumonia vaccine(s):  n/a Shingrix:   Has discussed with Dr. Waynard Edwards.  She is hesitant about getting this right now.   Hep C testing: 08/23/15 Neg  Screening Labs: PCP   reports that she has never smoked. She has never used smokeless tobacco. She reports that she does not drink alcohol or use drugs.  Past Medical History:  Diagnosis Date  . Anxiety   . ASCUS (atypical squamous cells of undetermined significance) on Pap smear 01/2007   NEG HIGH RISK HPV--BENIGN CHANGES ON 11/2007 PAP  . Depression    in her 59's and off & on  . Myopic macular degeneration   . Osteoarthritis    Rt.knee    Past Surgical History:  Procedure Laterality Date  . BREAST CYST ASPIRATION    . CATARACT EXTRACTION Left 2018  . TONSILLECTOMY AND ADENOIDECTOMY      Current Outpatient Medications  Medication Sig Dispense Refill  . FLUoxetine (PROZAC) 20 MG capsule Take 1 capsule (20 mg total) by mouth daily. 90 capsule 4  . Melatonin 10 MG TABS Take 10 mg by mouth as needed.     . Multiple Vitamin (MULTI-VITAMINS) TABS Take 1 tablet by mouth daily.     No current facility-administered  medications for this visit.     Family History  Problem Relation Age of Onset  . Cancer Mother 69       OVARIAN CANCER/1987  . Hypertension Mother   . Osteoporosis Mother   . Ovarian cancer Mother        deceased  . Rheum arthritis Paternal Grandmother     Review of Systems  All other systems reviewed and are negative.   Exam:   BP 136/80 (BP Location: Right Arm, Patient Position: Sitting, Cuff Size: Normal)   Pulse 76   Resp 16   Ht 5' 7.5" (1.715 m)   Wt 151 lb 3.2 oz (68.6 kg)   LMP 04/26/2014   BMI 23.33 kg/m     Height: 5' 7.5" (171.5 cm)  Ht Readings from Last 3 Encounters:  02/18/18 5' 7.5" (1.715 m)  12/14/17 5\' 8"  (1.727 m)  10/12/17 5\' 8"  (1.727 m)    General appearance: alert, cooperative and appears stated age Head: Normocephalic, without obvious abnormality, atraumatic Neck: no adenopathy, supple, symmetrical, trachea midline and thyroid normal to inspection and palpation Lungs: clear to auscultation bilaterally Breasts: normal appearance, no masses or tenderness Heart: regular rate and rhythm Abdomen: soft, non-tender; bowel sounds normal; no masses,  no  organomegaly Extremities: extremities normal, atraumatic, no cyanosis or edema Skin: Skin color, texture, turgor normal. No rashes or lesions Lymph nodes: Cervical, supraclavicular, and axillary nodes normal. No abnormal inguinal nodes palpated Neurologic: Grossly normal   Pelvic: External genitalia:  no lesions              Urethra:  normal appearing urethra with no masses, tenderness or lesions              Bartholins and Skenes: normal                 Vagina: normal appearing vagina with normal color and discharge, no lesions              Cervix: cervical polyp noted              Pap taken: No. Bimanual Exam:  Uterus:  normal size, contour, position, consistency, mobility, non-tender              Adnexa: normal adnexa and no mass, fullness, tenderness               Rectovaginal: Confirms                Anus:  normal sphincter tone, no lesions  Procedure:  Polyp grasped with polyp forcep and twisted at base until fully removed.  Scant bleeding noted.  Pt tolerated procedure well.  Chaperone was present for exam.  A:  Well Woman with normal exam PMP, no HRT Joint pain, followed by Dr. Corliss Skains Family hx of ovarian cancer in her mother  P:   Mammogram due.  Pt aware and will schedule pap smear with neg HR HPV 11/18 Polyp sent to pathology D/w pt shingrix vaccination Blood work UTD Rx for Prozac 20mg  daily.  #90/4RF to mail order pharmacy. Return annually or prn

## 2018-02-22 ENCOUNTER — Telehealth: Payer: Self-pay | Admitting: Rheumatology

## 2018-02-22 ENCOUNTER — Telehealth: Payer: Self-pay

## 2018-02-22 ENCOUNTER — Other Ambulatory Visit: Payer: Self-pay | Admitting: Obstetrics & Gynecology

## 2018-02-22 DIAGNOSIS — Z1231 Encounter for screening mammogram for malignant neoplasm of breast: Secondary | ICD-10-CM

## 2018-02-22 NOTE — Telephone Encounter (Addendum)
Please apply for right knee euflexxa, per Sherron Ales. Thanks!

## 2018-02-22 NOTE — Telephone Encounter (Signed)
I had a series of gel shots in my right knee in July 2019. I am noticing increasing discomfort again and would like to arrange another series of shots if possible, since it has been six months, and am training now for a swimming/cycling event in May. I'd like the same gel type I received before since it worked so well. Thank you!  Cheyna Angus [phone]    Patient left this message on her Mychart!  I called patient and told her I would forward her message to Dr. Corliss Skains.

## 2018-02-22 NOTE — Telephone Encounter (Signed)
Message has been sent to Brenda Nicholson to reapply and I attempted to contact patient and left message on machine to advise her we would apply.

## 2018-02-22 NOTE — Telephone Encounter (Signed)
Ok to reapply for euflexxa injections for the right knee joint.

## 2018-02-22 NOTE — Telephone Encounter (Signed)
Patient had bilateral knee Euflexxa in 07/2017. Okay to reapply?

## 2018-02-23 NOTE — Telephone Encounter (Signed)
Noted  

## 2018-02-25 ENCOUNTER — Ambulatory Visit
Admission: RE | Admit: 2018-02-25 | Discharge: 2018-02-25 | Disposition: A | Discharge status: Home / Self Care | Payer: 59 | Source: Ambulatory Visit

## 2018-02-25 DIAGNOSIS — Z1231 Encounter for screening mammogram for malignant neoplasm of breast: Secondary | ICD-10-CM

## 2018-02-28 ENCOUNTER — Telehealth (INDEPENDENT_AMBULATORY_CARE_PROVIDER_SITE_OTHER): Payer: Self-pay

## 2018-02-28 NOTE — Telephone Encounter (Signed)
Submitted VOB for Euflexxa series, right knee. 

## 2018-03-01 ENCOUNTER — Encounter: Payer: Self-pay | Admitting: Obstetrics & Gynecology

## 2018-03-01 ENCOUNTER — Telehealth: Payer: Self-pay | Admitting: Obstetrics & Gynecology

## 2018-03-01 NOTE — Telephone Encounter (Signed)
Last AEX 02/18/18  Routing to Dr. Hyacinth Meeker to review and advise.

## 2018-03-01 NOTE — Telephone Encounter (Signed)
Message   Dr. Hyacinth Meeker,    It was so nice to see you recently at my appointment. I always appreciate your attentive care.   I realized afterward that I had another question that I wished I had asked, about an alternative to Prozac/fluoxetine.   Taking it has helped me tremendously with depression and anxiety, but it also causes a crash in libido that bothers me. I've accepted it, as a price to pay for feeling better, but I wish I didn't have to.   I recall you saying in the past that a different antidepressant may help ease my post-menopausal symptoms (joint pain/fatigue), in addition to the depression/anxiety. If it could help with the libido issue too, I'd be interested in trying it. Do you have any suggestions?   I don't know how I managed to not remember to ask this when I saw you. I hope it's ok to follow up with this email question.  Thank you,    Brenda Nicholson

## 2018-03-02 MED ORDER — DULOXETINE HCL 20 MG PO CPEP: 20.0000 mg | ORAL_CAPSULE | Freq: Every day | ORAL | 0 refills | Status: DC

## 2018-03-02 NOTE — Telephone Encounter (Signed)
She could consider switching to 20mg  cymbalta.  That is just a direct switch--go straight from the prozac to the Cymbalta.  She does not need to taper down the prozac.  Still has libido side effects but typically less.  Ok to call in rx for pt if desired for #30 days.  Should give follow up report in about two weeks.

## 2018-03-02 NOTE — Telephone Encounter (Signed)
Spoke with patient, advised as seen below per Dr. Hyacinth Meeker. Patient request Rx for Cymbalta 20 mg to pharmacy on file, will call with update in 2wks. Patient verbalize understanding and is thankful for return call.   Rx for Cymbalta 20mg  po daily #30/0RF to pharmacy. Discontinued Prozac.   Routing to provider for final review. Patient is agreeable to disposition. Will close encounter.

## 2018-03-08 ENCOUNTER — Telehealth (INDEPENDENT_AMBULATORY_CARE_PROVIDER_SITE_OTHER): Payer: Self-pay

## 2018-03-08 NOTE — Telephone Encounter (Signed)
FYI-  Euflexxa is a non-preferred product for [employer].  Submitted for Visco-3, right knee. Hope this is okay.

## 2018-03-08 NOTE — Telephone Encounter (Signed)
Will submit for Visco-3, right knee due to Euflexxa not being a preferred product. Submitted for Visco-3, right knee.

## 2018-03-08 NOTE — Telephone Encounter (Signed)
Thank you :)

## 2018-03-15 ENCOUNTER — Telehealth: Payer: Self-pay | Admitting: Rheumatology

## 2018-03-15 NOTE — Telephone Encounter (Signed)
Patient left a voicemail checking the status of her Euflexxa injections.  Patient states she is taking a trip in May and is hoping her insurance company approves the injections so she has time to schedule the appointments.

## 2018-03-21 ENCOUNTER — Telehealth (INDEPENDENT_AMBULATORY_CARE_PROVIDER_SITE_OTHER): Payer: Self-pay

## 2018-03-21 NOTE — Telephone Encounter (Signed)
Visco-3 is a non preferred product for [employer].  Euflexxa, Orthovisc, and Monovisc are preferred products for [employer].  Per Brenda Nicholson., injection was changed to Euflexxa.   Patient has been Approved for Euflexxa, right knee. Buy & Bill Valid 03/16/2018-03/15/2019 Reference# [id number]

## 2018-03-21 NOTE — Telephone Encounter (Signed)
Please schedule patient an appointment with Dr. Estanislado Pandy or Lovena Le for gel injection.  Thank you.  Approved for Euflexxa, right knee. [place] must be met before coverage applies. Patient will be responsible for 15% OOP. No Co-pay PA required PA Approval# [id number] Valid 03/16/2018- 03/15/2019

## 2018-03-25 ENCOUNTER — Encounter: Payer: Self-pay | Admitting: Rheumatology

## 2018-03-29 ENCOUNTER — Ambulatory Visit: Payer: 59 | Admitting: Physician Assistant

## 2018-04-05 ENCOUNTER — Ambulatory Visit: Payer: 59 | Admitting: Physician Assistant

## 2018-04-12 ENCOUNTER — Ambulatory Visit: Payer: 59 | Admitting: Physician Assistant

## 2018-04-14 ENCOUNTER — Ambulatory Visit: Payer: Commercial Managed Care - PPO | Admitting: Physician Assistant

## 2018-04-20 ENCOUNTER — Encounter: Payer: Self-pay | Admitting: Rheumatology

## 2018-05-05 ENCOUNTER — Encounter: Payer: Self-pay | Admitting: Rheumatology

## 2018-05-16 ENCOUNTER — Ambulatory Visit (INDEPENDENT_AMBULATORY_CARE_PROVIDER_SITE_OTHER): Payer: 59 | Admitting: Physician Assistant

## 2018-05-16 ENCOUNTER — Other Ambulatory Visit: Payer: Self-pay

## 2018-05-16 DIAGNOSIS — M1711 Unilateral primary osteoarthritis, right knee: Secondary | ICD-10-CM

## 2018-05-16 MED ORDER — SODIUM HYALURONATE (VISCOSUP) 20 MG/2ML IX SOSY
20.0000 mg | PREFILLED_SYRINGE | INTRA_ARTICULAR | Status: AC | PRN
  Administered 2018-05-16: 20 mg via INTRA_ARTICULAR

## 2018-05-16 MED ORDER — LIDOCAINE HCL 1 % IJ SOLN
1.5000 mL | INTRAMUSCULAR | Status: AC | PRN
  Administered 2018-05-16: 1.5 mL

## 2018-05-16 NOTE — Progress Notes (Signed)
    Procedure Note  Patient: Brenda Nicholson             Date of Birth: May 05, 1960           MRN: [id number]             Visit Date: 05/16/2018  Procedures: Visit Diagnoses: Unilateral primary osteoarthritis, right knee - Plan: Large Joint Inj: R knee Euflexxa #1 Right knee joint injection  Large Joint Inj: R knee on 05/16/2018 8:40 AM Indications: pain Details: 27 G 1.5 in needle, medial approach  Arthrogram: No  Medications: 20 mg Sodium Hyaluronate 20 MG/2ML; 1.5 mL lidocaine 1 % Aspirate: 0 mL Outcome: tolerated well, no immediate complications Procedure, treatment alternatives, risks and benefits explained, specific risks discussed. Consent was given by the patient. Immediately prior to procedure a time out was called to verify the correct patient, procedure, equipment, support staff and site/side marked as required. Patient was prepped and draped in the usual sterile fashion.     Patient tolerated the procedure well.  Sherron Ales, PA-C

## 2018-05-20 ENCOUNTER — Encounter: Payer: Self-pay | Admitting: Rheumatology

## 2018-05-23 ENCOUNTER — Ambulatory Visit (INDEPENDENT_AMBULATORY_CARE_PROVIDER_SITE_OTHER): Payer: 59 | Admitting: Physician Assistant

## 2018-05-23 ENCOUNTER — Other Ambulatory Visit: Payer: Self-pay

## 2018-05-23 DIAGNOSIS — M1711 Unilateral primary osteoarthritis, right knee: Secondary | ICD-10-CM

## 2018-05-23 MED ORDER — LIDOCAINE HCL 1 % IJ SOLN
1.5000 mL | INTRAMUSCULAR | Status: AC | PRN
  Administered 2018-05-23: 1.5 mL

## 2018-05-23 MED ORDER — SODIUM HYALURONATE (VISCOSUP) 20 MG/2ML IX SOSY
20.0000 mg | PREFILLED_SYRINGE | INTRA_ARTICULAR | Status: AC | PRN
  Administered 2018-05-23: 20 mg via INTRA_ARTICULAR

## 2018-05-23 NOTE — Progress Notes (Signed)
   Procedure Note  Patient: Brenda Nicholson             Date of Birth: 10/29/1960           MRN: [id number]             Visit Date: 05/23/2018  Procedures: Visit Diagnoses: Unilateral primary osteoarthritis, right knee - Plan: Large Joint Inj: R knee Euflexxa #2 right knee joint injection  Large Joint Inj: R knee on 05/23/2018 9:05 AM Indications: pain Details: 27 G 1.5 in needle, medial approach  Arthrogram: No  Medications: 20 mg Sodium Hyaluronate 20 MG/2ML; 1.5 mL lidocaine 1 % Aspirate: 0 mL Outcome: tolerated well, no immediate complications Procedure, treatment alternatives, risks and benefits explained, specific risks discussed. Consent was given by the patient. Immediately prior to procedure a time out was called to verify the correct patient, procedure, equipment, support staff and site/side marked as required. Patient was prepped and draped in the usual sterile fashion.     Patient tolerated the procedure well.  Sherron Ales, PA-C

## 2018-05-31 ENCOUNTER — Other Ambulatory Visit: Payer: Self-pay

## 2018-05-31 ENCOUNTER — Ambulatory Visit (INDEPENDENT_AMBULATORY_CARE_PROVIDER_SITE_OTHER): Payer: 59 | Admitting: Physician Assistant

## 2018-05-31 DIAGNOSIS — M1711 Unilateral primary osteoarthritis, right knee: Secondary | ICD-10-CM

## 2018-05-31 MED ORDER — LIDOCAINE HCL 1 % IJ SOLN
1.5000 mL | INTRAMUSCULAR | Status: AC | PRN
  Administered 2018-05-31: 1.5 mL

## 2018-05-31 MED ORDER — SODIUM HYALURONATE (VISCOSUP) 20 MG/2ML IX SOSY
20.0000 mg | PREFILLED_SYRINGE | INTRA_ARTICULAR | Status: AC | PRN
  Administered 2018-05-31: 20 mg via INTRA_ARTICULAR

## 2018-05-31 NOTE — Progress Notes (Signed)
   Procedure Note  Patient: Brenda Nicholson             Date of Birth: 16-Feb-1960           MRN: [id number]             Visit Date: 05/31/2018  Procedures: Visit Diagnoses: Unilateral primary osteoarthritis, right knee - Plan: Large Joint Inj: R knee Euflexxa #3 Right knee joint injection  Large Joint Inj: R knee on 05/31/2018 8:31 AM Indications: pain Details: 27 G 1.5 in needle, medial approach  Arthrogram: No  Medications: 20 mg Sodium Hyaluronate 20 MG/2ML; 1.5 mL lidocaine 1 % Aspirate: 0 mL Outcome: tolerated well, no immediate complications Procedure, treatment alternatives, risks and benefits explained, specific risks discussed. Consent was given by the patient. Immediately prior to procedure a time out was called to verify the correct patient, procedure, equipment, support staff and site/side marked as required. Patient was prepped and draped in the usual sterile fashion.    This patient is diagnosed with osteoarthritis of the knee(s).    Radiographs show evidence of joint space narrowing, osteophytes, subchondral sclerosis and/or subchondral cysts.  This patient has knee pain which interferes with functional and activities of daily living.    This patient has experienced inadequate response, adverse effects and/or intolerance with conservative treatments such as acetaminophen, NSAIDS, topical creams, physical therapy or regular exercise, knee bracing and/or weight loss.   This patient has experienced inadequate response or has a contraindication to intra articular steroid injections for at least 3 months.   This patient is not scheduled to have a total knee replacement within 6 months of starting treatment with viscosupplementation.  Patient tolerated the procedure well.   Sherron Ales, PA-C

## 2018-06-02 ENCOUNTER — Telehealth: Payer: Self-pay | Admitting: Rheumatology

## 2018-06-02 NOTE — Telephone Encounter (Signed)
Patient can hardly walk since last gel injection this week. Knee is swollen, and painful to weight bear. Patient using Tylenol / Ibuprofen / icing it. Patient would like to know how long she needs to wait before coming back for a cortisone injection. Please call to advise.

## 2018-06-02 NOTE — Telephone Encounter (Signed)
Attempted to contact the patient and left message for patient.

## 2018-06-06 NOTE — Progress Notes (Signed)
   Procedure Note  Patient: Brenda Nicholson             Date of Birth: 1960-10-28           MRN: [id number]             Visit Date: 06/07/2018  Procedures: Visit Diagnoses: Unilateral primary osteoarthritis, right knee  Large Joint Inj: R knee on 06/07/2018 8:11 AM Indications: pain Details: 27 G 1.5 in needle, medial approach  Arthrogram: No  Medications: 1.5 mL lidocaine 1 %; 40 mg triamcinolone acetonide 40 MG/ML Aspirate: 0 mL Outcome: tolerated well, no immediate complications Procedure, treatment alternatives, risks and benefits explained, specific risks discussed. Consent was given by the patient. Immediately prior to procedure a time out was called to verify the correct patient, procedure, equipment, support staff and site/side marked as required. Patient was prepped and draped in the usual sterile fashion.     Patient tolerated the procedure well.  Sherron Ales, PA-C

## 2018-06-06 NOTE — Telephone Encounter (Signed)
Scheduled for 06/07/18 at 9:00 am.

## 2018-06-06 NOTE — Telephone Encounter (Signed)
Patient states states she is having swelling after her last gel inejction. Patient states the swelling in her knee started last Tuesday. Patient states it is not hot to the touch. Patient states she has been icing it and has been taking antiinflammatories. Patient states she is not getting much relief. Patient states she is not able to bear weight on her right knee. Patient would like to know if she can have a cortisone injection in her knee. Please advise.

## 2018-06-06 NOTE — Telephone Encounter (Signed)
Ok to proceed with scheduling a right knee cortisone injection this week.  She has been experiencing pain and swelling in the right knee for the past several months despite taking antiinflammatories, icing, and having visco injection performed.

## 2018-06-07 ENCOUNTER — Encounter: Payer: Self-pay | Admitting: Physician Assistant

## 2018-06-07 ENCOUNTER — Ambulatory Visit (INDEPENDENT_AMBULATORY_CARE_PROVIDER_SITE_OTHER): Payer: 59 | Admitting: Physician Assistant

## 2018-06-07 ENCOUNTER — Other Ambulatory Visit: Payer: Self-pay

## 2018-06-07 VITALS — BP 132/83 | HR 73 | Resp 12 | Ht 68.0 in | Wt 150.0 lb

## 2018-06-07 DIAGNOSIS — I7300 Raynaud's syndrome without gangrene: Secondary | ICD-10-CM

## 2018-06-07 DIAGNOSIS — R5383 Other fatigue: Secondary | ICD-10-CM | POA: Diagnosis not present

## 2018-06-07 DIAGNOSIS — Z8659 Personal history of other mental and behavioral disorders: Secondary | ICD-10-CM

## 2018-06-07 DIAGNOSIS — Z9842 Cataract extraction status, left eye: Secondary | ICD-10-CM

## 2018-06-07 DIAGNOSIS — M359 Systemic involvement of connective tissue, unspecified: Secondary | ICD-10-CM

## 2018-06-07 DIAGNOSIS — M1711 Unilateral primary osteoarthritis, right knee: Secondary | ICD-10-CM

## 2018-06-07 MED ORDER — LIDOCAINE HCL 1 % IJ SOLN
1.5000 mL | INTRAMUSCULAR | Status: AC | PRN
  Administered 2018-06-07: 1.5 mL

## 2018-06-07 MED ORDER — TRIAMCINOLONE ACETONIDE 40 MG/ML IJ SUSP
40.0000 mg | INTRAMUSCULAR | Status: AC | PRN
  Administered 2018-06-07: 40 mg via INTRA_ARTICULAR

## 2018-06-07 NOTE — Progress Notes (Signed)
Office Visit Note  Patient: Brenda Nicholson             Date of Birth: 06/02/1960           MRN: [id number]             PCP: Rodrigo RanPerini, Mark, MD Referring: Rodrigo RanPerini, Mark, MD Visit Date: 06/07/2018 Occupation: @GUAROCC @  Subjective:  Right knee joint pain  History of Present Illness: Brenda Nicholson is a 58 y.o. female with history of autoimmune disease and osteoarthritis.  Brenda Nicholson presents today with chronic right knee joint pain and swelling.  Brenda Nicholson underwent right knee visco gel injections in May 2020. Brenda Nicholson noticed some improvement halfway through the series but started having increased discomfort 3 days ago.  Brenda Nicholson is having difficulty with ROM and bearing full weight.  Brenda Nicholson is currently using a cane for assistance. Brenda Nicholson reports Brenda Nicholson feels her right knee has worsened due to delaying visco injections and being sedentary. Brenda Nicholson has tried icing, resting, and taking antiinflammatories.  Brenda Nicholson has tried advil and tylenol with minimal relief.  Brenda Nicholson states for 1 week Brenda Nicholson took expired meloxicam, and Brenda Nicholson states Brenda Nicholson noticed increased hair loss. Brenda Nicholson has an appointment with Dr. Lequita HaltAluisio on 06/24/18 to discuss scheduling a right knee joint replacement in the future.  Brenda Nicholson denies any other joint pain or joint swelling.  Brenda Nicholson denies any recent rashes, sores in mouth or nose, sicca symptoms, photosensitivity, or recent symptoms of Raynaud's.     Activities of Daily Living:  Patient reports joint stiffness all day.  Patient Reports nocturnal pain.  Difficulty dressing/grooming: Reports Difficulty climbing stairs: Reports Difficulty getting out of chair: Reports Difficulty using hands for taps, buttons, cutlery, and/or writing: Denies  Review of Systems  Constitutional: Negative for fatigue.  HENT: Negative for mouth sores, mouth dryness and nose dryness.   Eyes: Negative for pain, itching, visual disturbance and dryness.  Respiratory: Negative for cough, hemoptysis, shortness of breath, wheezing and  difficulty breathing.   Cardiovascular: Negative for chest pain, palpitations, hypertension and swelling in legs/feet.  Gastrointestinal: Negative for abdominal pain, blood in stool, constipation and diarrhea.  Endocrine: Negative for increased urination.  Genitourinary: Negative for painful urination.  Musculoskeletal: Positive for arthralgias, joint pain, joint swelling and morning stiffness. Negative for myalgias, muscle weakness, muscle tenderness and myalgias.  Skin: Positive for hair loss. Negative for color change, pallor, rash, nodules/bumps, redness, skin tightness, ulcers and sensitivity to sunlight.  Allergic/Immunologic: Negative for susceptible to infections.  Neurological: Negative for dizziness, numbness, headaches, memory loss and weakness.  Hematological: Negative for bruising/bleeding tendency and swollen glands.  Psychiatric/Behavioral: Negative for depressed mood, confusion and sleep disturbance. The patient is not nervous/anxious.     PMFS History:  Patient Active Problem List   Diagnosis Date Noted  . Autoimmune disease (HCC) 02/22/2017  . Status post left cataract extraction 01/12/2017  . Primary osteoarthritis of right knee 01/12/2017  . History of anxiety 01/12/2017  . History of depression 01/12/2017  . Dislocated intraocular lens 04/23/2016  . Degenerative myopia with choroidal neovascularization, right eye 12/05/2015  . Combined forms of age-related cataract of both eyes 12/05/2015  . Polyarthralgia 08/23/2015  . Myopia of both eyes 01/10/2014  . Family history of ovarian cancer 12/19/2013  . H/O amblyopia 05/10/2013  . Nuclear sclerosis 05/10/2013  . H/O: vasectomy     Past Medical History:  Diagnosis Date  . Anxiety   . ASCUS (atypical squamous cells of undetermined significance) on Pap smear 01/2007   NEG  HIGH RISK HPV--BENIGN CHANGES ON 11/2007 PAP  . Depression    in her 76's and off & on  . Myopic macular degeneration   . Osteoarthritis     Rt.knee    Family History  Problem Relation Age of Onset  . Cancer Mother 83       OVARIAN CANCER/1987  . Hypertension Mother   . Osteoporosis Mother   . Ovarian cancer Mother        deceased  . Rheum arthritis Paternal Grandmother   . Breast cancer Neg Hx    Past Surgical History:  Procedure Laterality Date  . BREAST CYST ASPIRATION    . CATARACT EXTRACTION Left 2018  . TONSILLECTOMY AND ADENOIDECTOMY     Social History   Social History Narrative  . Not on file   Immunization History  Administered Date(s) Administered  . Influenza Split 10/22/2010  . Tdap 02/13/2011     Objective: Vital Signs: BP 132/83 (BP Location: Right Arm, Patient Position: Sitting, Cuff Size: Normal)   Pulse 73   Resp 12   Ht 5\' 8"  (1.727 m)   Wt 150 lb (68 kg)   LMP 04/26/2014   BMI 22.81 kg/m    Physical Exam Vitals signs and nursing note reviewed.  Constitutional:      Appearance: Brenda Nicholson is well-developed.  HENT:     Head: Normocephalic and atraumatic.  Eyes:     Conjunctiva/sclera: Conjunctivae normal.  Neck:     Musculoskeletal: Normal range of motion.  Cardiovascular:     Rate and Rhythm: Normal rate and regular rhythm.     Heart sounds: Normal heart sounds.  Pulmonary:     Effort: Pulmonary effort is normal.     Breath sounds: Normal breath sounds.  Abdominal:     General: Bowel sounds are normal.     Palpations: Abdomen is soft.  Lymphadenopathy:     Cervical: No cervical adenopathy.  Skin:    General: Skin is warm and dry.     Capillary Refill: Capillary refill takes less than 2 seconds.  Neurological:     Mental Status: Brenda Nicholson is alert and oriented to person, place, and time.  Psychiatric:        Behavior: Behavior normal.      Musculoskeletal Exam: C-spine, thoracic spine, and lumbar spine good ROM.  No midline spinal tenderness.  No SI joint tenderness.  Shoulder joints, elbow joints, wrist joints, MCPs, PIPs, and DIPs good ROM with no synovitis.  Hip joints good  ROM.  Right knee warmth and swelling noted.  Left knee has good ROM with no warmth or effusion.  Bilateral knee joint crepitus.  No tenderness or swelling of ankle joints.   CDAI Exam: CDAI Score: Not documented Patient Global Assessment: Not documented; Provider Global Assessment: Not documented Swollen: Not documented; Tender: Not documented Joint Exam   Not documented   There is currently no information documented on the homunculus. Go to the Rheumatology activity and complete the homunculus joint exam.  Investigation: No additional findings.  Imaging: No results found.  Recent Labs: Lab Results  Component Value Date   WBC 4.7 01/12/2017   HGB 13.6 01/12/2017   PLT 217 01/12/2017   NA 141 01/12/2017   K 4.4 01/12/2017   CL 105 01/12/2017   CO2 30 01/12/2017   GLUCOSE 92 01/12/2017   BUN 19 01/12/2017   CREATININE 0.86 01/12/2017   BILITOT 0.4 01/12/2017   ALKPHOS 48 08/23/2015   AST 16 01/12/2017   ALT  18 01/12/2017   PROT 6.1 01/12/2017   ALBUMIN 4.3 08/23/2015   CALCIUM 9.2 01/12/2017   GFRAA 86 01/12/2017    Speciality Comments: No specialty comments available.  Procedures:  Large Joint Inj: R knee on 06/07/2018 9:38 AM Indications: pain Details: 27 G 1.5 in needle, medial approach  Arthrogram: No  Medications: 1.5 mL lidocaine 1 %; 40 mg triamcinolone acetonide 40 MG/ML Aspirate: 0 mL Outcome: tolerated well, no immediate complications Procedure, treatment alternatives, risks and benefits explained, specific risks discussed. Consent was given by the patient. Immediately prior to procedure a time out was called to verify the correct patient, procedure, equipment, support staff and site/side marked as required. Patient was prepped and draped in the usual sterile fashion.     Allergies: Patient has no known allergies.   Assessment / Plan:     Visit Diagnoses: Autoimmune disease (HCC) - ANA 1:160 NH, +dsDNA, fatigue, malar rash, Raynaud's, inflammatory  arthritis. PLQ not recommended-patient reports history of retinal bleed., myopic MD: Brenda Nicholson presents today with right knee joint pain and swelling.  Brenda Nicholson has no other joint pain or joint swelling at this time.  Brenda Nicholson has no other clinical features of autoimmune disease at this time.  Brenda Nicholson will schedule an appointment to obtain a device lab work.  Brenda Nicholson is advised to notify us if develops any new or worsening symptoms.  Brenda Nicholson will follow-up in the office in 6 months.  Raynaud's syndrome without gangrene: Brenda Nicholson has not had any recent symptoms of Raynaud's.  No digital ulcerations or signs of gangrene.   Unilateral primary osteoarthritis, right knee -Brenda Nicholson presents today with right knee joint pain and swelling.  Brenda Nicholson has limited flexion extension due to discomfort.  No erythema was noted.  Brenda Nicholson is been having difficulty bearing weight due to discomfort Brenda Nicholson has been experiencing.  Brenda Nicholson also experiences mechanical symptoms.  Brenda Nicholson underwent Visco gel injections of the right knee joint in May 2020 provided temporary relief.  Her discomfort returned about 3 days ago.  Brenda Nicholson is been taking Advil and Tylenol PRN for pain relief.  Brenda Nicholson is also tried icing and elevating her right knee joint.  Brenda Nicholson had MRI of the right knee joint performed on 07/14/2016 that revealed advanced for age degenerative disease of the lateral compartment with degenerative maceration of the anterior horn of the lateral meniscus.  Brenda Nicholson has an appointment Dr. Lequita Halt on 06/24/2018 to discuss the future knee replacement.  Brenda Nicholson requested a right knee cortisone injection today.  Brenda Nicholson tolerated the procedure well.  The procedure note was completed above.  Plan: Large Joint Inj: R knee  Other fatigue: Stable  Other medical conditions are listed as follows:   Status post left cataract extraction  History of anxiety  History of depression   Orders: Orders Placed This Encounter  Procedures  . Large Joint Inj: R knee  . Large Joint Inj   No orders of the defined types  were placed in this encounter.   Face-to-face time spent with patient was 30 minutes. Greater than 50% of time was spent in counseling and coordination of care.  Follow-Up Instructions: Return in about 6 months (around 12/07/2018) for Autoimmune Disease, Osteoarthritis.   Gearldine Bienenstock, PA-C  Note - This record has been created using Dragon software.  Chart creation errors have been sought, but may not always  have been located. Such creation errors do not reflect on  the standard of medical care.

## 2018-08-15 ENCOUNTER — Ambulatory Visit: Payer: 59 | Admitting: Allergy and Immunology

## 2018-08-23 DIAGNOSIS — Z96651 Presence of right artificial knee joint: Secondary | ICD-10-CM | POA: Insufficient documentation

## 2018-08-23 HISTORY — PX: REPLACEMENT TOTAL KNEE: SUR1224

## 2018-11-10 ENCOUNTER — Other Ambulatory Visit: Payer: Self-pay

## 2018-11-10 DIAGNOSIS — Z20822 Contact with and (suspected) exposure to covid-19: Secondary | ICD-10-CM

## 2018-11-11 LAB — NOVEL CORONAVIRUS, NAA: SARS-CoV-2, NAA: NOT DETECTED

## 2018-11-23 NOTE — Progress Notes (Deleted)
Office Visit Note  Patient: Brenda Nicholson             Date of Birth: 10-30-1960           MRN: [id number]             PCP: Rodrigo Ran, MD Referring: Rodrigo Ran, MD Visit Date: 12/07/2018 Occupation: @GUAROCC @  Subjective:  No chief complaint on file.   History of Present Illness: Brenda Nicholson is a 58 y.o. female ***   Activities of Daily Living:  Patient reports morning stiffness for *** {minute/hour:19697}.   Patient {ACTIONS;DENIES/REPORTS:21021675::"Denies"} nocturnal pain.  Difficulty dressing/grooming: {ACTIONS;DENIES/REPORTS:21021675::"Denies"} Difficulty climbing stairs: {ACTIONS;DENIES/REPORTS:21021675::"Denies"} Difficulty getting out of chair: {ACTIONS;DENIES/REPORTS:21021675::"Denies"} Difficulty using hands for taps, buttons, cutlery, and/or writing: {ACTIONS;DENIES/REPORTS:21021675::"Denies"}  No Rheumatology ROS completed.   PMFS History:  Patient Active Problem List   Diagnosis Date Noted  . Autoimmune disease (HCC) 02/22/2017  . Status post left cataract extraction 01/12/2017  . Primary osteoarthritis of right knee 01/12/2017  . History of anxiety 01/12/2017  . History of depression 01/12/2017  . Dislocated intraocular lens 04/23/2016  . Degenerative myopia with choroidal neovascularization, right eye 12/05/2015  . Combined forms of age-related cataract of both eyes 12/05/2015  . Polyarthralgia 08/23/2015  . Myopia of both eyes 01/10/2014  . Family history of ovarian cancer 12/19/2013  . H/O amblyopia 05/10/2013  . Nuclear sclerosis 05/10/2013  . H/O: vasectomy     Past Medical History:  Diagnosis Date  . Anxiety   . ASCUS (atypical squamous cells of undetermined significance) on Pap smear 01/2007   NEG HIGH RISK HPV--BENIGN CHANGES ON 11/2007 PAP  . Depression    in her 89's and off & on  . Myopic macular degeneration   . Osteoarthritis    Rt.knee    Family History  Problem Relation Age of Onset  . Cancer Mother 80       OVARIAN CANCER/1987  . Hypertension Mother   . Osteoporosis Mother   . Ovarian cancer Mother        deceased  . Rheum arthritis Paternal Grandmother   . Breast cancer Neg Hx    Past Surgical History:  Procedure Laterality Date  . BREAST CYST ASPIRATION    . CATARACT EXTRACTION Left 2018  . TONSILLECTOMY AND ADENOIDECTOMY     Social History   Social History Narrative  . Not on file   Immunization History  Administered Date(s) Administered  . Influenza Split 10/22/2010  . Tdap 02/13/2011     Objective: Vital Signs: LMP 04/26/2014    Physical Exam   Musculoskeletal Exam: ***  CDAI Exam: CDAI Score: - Patient Global: -; Provider Global: - Swollen: -; Tender: - Joint Exam   No joint exam has been documented for this visit   There is currently no information documented on the homunculus. Go to the Rheumatology activity and complete the homunculus joint exam.  Investigation: No additional findings.  Imaging: No results found.  Recent Labs: Lab Results  Component Value Date   WBC 4.7 01/12/2017   HGB 13.6 01/12/2017   PLT 217 01/12/2017   NA 141 01/12/2017   K 4.4 01/12/2017   CL 105 01/12/2017   CO2 30 01/12/2017   GLUCOSE 92 01/12/2017   BUN 19 01/12/2017   CREATININE 0.86 01/12/2017   BILITOT 0.4 01/12/2017   ALKPHOS 48 08/23/2015   AST 16 01/12/2017   ALT 18 01/12/2017   PROT 6.1 01/12/2017   ALBUMIN 4.3 08/23/2015   CALCIUM 9.2 01/12/2017  GFRAA 86 01/12/2017    Speciality Comments: No specialty comments available.  Procedures:  No procedures performed Allergies: Patient has no known allergies.   Assessment / Plan:     Visit Diagnoses: No diagnosis found.  Orders: No orders of the defined types were placed in this encounter.  No orders of the defined types were placed in this encounter.   Face-to-face time spent with patient was *** minutes. Greater than 50% of time was spent in counseling and coordination of care.  Follow-Up  Instructions: No follow-ups on file.   Earnestine Mealing, CMA  Note - This record has been created using Editor, commissioning.  Chart creation errors have been sought, but may not always  have been located. Such creation errors do not reflect on  the standard of medical care.

## 2018-12-05 ENCOUNTER — Encounter: Payer: Self-pay | Admitting: Rheumatology

## 2018-12-07 ENCOUNTER — Ambulatory Visit: Payer: 59 | Admitting: Rheumatology

## 2019-02-15 ENCOUNTER — Other Ambulatory Visit: Payer: Self-pay

## 2019-02-20 ENCOUNTER — Other Ambulatory Visit: Payer: Self-pay

## 2019-02-20 ENCOUNTER — Encounter: Payer: Self-pay | Admitting: Obstetrics & Gynecology

## 2019-02-20 ENCOUNTER — Ambulatory Visit (INDEPENDENT_AMBULATORY_CARE_PROVIDER_SITE_OTHER): Payer: 59 | Admitting: Obstetrics & Gynecology

## 2019-02-20 VITALS — BP 114/70 | HR 84 | Temp 97.7°F | Resp 10 | Ht 68.0 in | Wt 160.0 lb

## 2019-02-20 DIAGNOSIS — Z01419 Encounter for gynecological examination (general) (routine) without abnormal findings: Secondary | ICD-10-CM | POA: Diagnosis not present

## 2019-02-20 MED ORDER — FLUOXETINE HCL 10 MG PO CAPS: 10.0000 mg | ORAL_CAPSULE | Freq: Two times a day (BID) | ORAL | 3 refills | Status: DC

## 2019-02-20 NOTE — Progress Notes (Signed)
59 y.o. G3P3 Married White or Caucasian female here for annual exam.  Doing well.  Denies vaginal bleeding.    Had total knee replacement in 2020.  This has helped so very much.   Had some injections prior to the surgery.  Has been followed by Dr. Corliss Skains due to +ANA.  No formal rheumatologist diagnosis.    Has done well with Prozac.  Decreased libido has been problematic.   PCP:  Dr. Waynard Edwards.  Will have appt in the spring.  Patient's last menstrual period was 04/26/2014.          Sexually active: Yes.    The current method of family planning is post menopausal status.    Exercising: Yes.    walking Smoker:  no  Health Maintenance: Pap:  12/04/16 Neg. HR HPV:neg              05/11/14 neg  History of abnormal Pap:  no MMG:  02/25/18 BIRADS 1 negative/density d Colonoscopy:  2016 f/u 10 years  BMD:   01/11/18 Osteopenia TDaP:  2013 Pneumonia vaccine(s):  n/a Shingrix:   Has decided not to get this right now Hep C testing: 08/23/15 Neg Screening Labs: PCP   reports that she has never smoked. She has never used smokeless tobacco. She reports that she does not drink alcohol or use drugs.  Past Medical History:  Diagnosis Date  . Anxiety   . ASCUS (atypical squamous cells of undetermined significance) on Pap smear 01/2007   NEG HIGH RISK HPV--BENIGN CHANGES ON 11/2007 PAP  . Depression    in her 20's and off & on  . Myopic macular degeneration   . Osteoarthritis    Rt.knee    Past Surgical History:  Procedure Laterality Date  . BREAST CYST ASPIRATION    . CATARACT EXTRACTION Left 2018  . 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 TONSILLECTOMY AND ADENOIDECTOMY      Current Outpatient Medications  Medication Sig Dispense Refill  . FLUoxetine (PROZAC) 20 MG capsule daily.    . Melatonin 10 MG TABS Take 10 mg by mouth as needed.     . Multiple Vitamin (MULTI-VITAMINS) TABS Take 1 tablet by mouth daily.     No current facility-administered medications for this visit.     Family History  Problem Relation Age of Onset  . Cancer Mother 32       OVARIAN CANCER/1987  . Hypertension Mother   . Osteoporosis Mother   . Ovarian cancer Mother        deceased  . Rheum arthritis Paternal Grandmother   . Breast cancer Neg Hx     Review of Systems  All other systems reviewed and are negative.   Exam:   BP 114/70 (BP Location: Right Arm, Patient Position: Sitting, Cuff Size: Normal)   Pulse 84   Temp 97.7 F (36.5 C) (Temporal)   Resp 10   Ht 5\' 8"  (1.727 m)   Wt 160 lb (72.6 kg)   LMP 04/26/2014   BMI 24.33 kg/m     Height: 5\' 8"  (172.7 cm)  Ht Readings from Last 3 Encounters:  02/20/19 5\' 8"  (1.727 m)  06/07/18 5\' 8"  (1.727 m)  02/18/18 5' 7.5" (1.715 m)    General appearance: alert, cooperative and appears stated age Head: Normocephalic, without obvious abnormality, atraumatic Neck: no adenopathy, supple, symmetrical, trachea midline and thyroid normal to inspection and palpation Lungs: clear to auscultation bilaterally Breasts: normal appearance, no masses or tenderness Heart: regular rate  and rhythm Abdomen: soft, non-tender; bowel sounds normal; no masses,  no organomegaly Extremities: extremities normal, atraumatic, no cyanosis or edema Skin: Skin color, texture, turgor normal. No rashes or lesions Lymph nodes: Cervical, supraclavicular, and axillary nodes normal. No abnormal inguinal nodes palpated Neurologic: Grossly normal   Pelvic: External genitalia:  no lesions              Urethra:  normal appearing urethra with no masses, tenderness or lesions              Bartholins and Skenes: normal                 Vagina: normal appearing vagina with normal color and discharge, no lesions              Cervix: no lesions              Pap taken: No. Bimanual Exam:  Uterus:  normal size, contour, position, consistency, mobility, non-tender              Adnexa: normal adnexa and no mass, fullness, tenderness               Rectovaginal:  Confirms               Anus:  normal sphincter tone, no lesions  Chaperone, Terence Lux, CMA, was present for exam.  A:  Well Woman with normal exam PMP, no HRT +ANA lab test, followed by Dr. Estanislado Pandy Family hx of ovarian cancer in her mother Anxiety with significant improvement with Prozac but with libido side effects  P:   Mammogram guidelines reviewed. BMD and colonoscopy are UTD pap smear neg with neg HR HPV 11/18.  2020 guidelines reviewed.  Aware not indicated today Will have appt with Dr. Joylene Draft in the spring.  Will do blood work then. Pt is going to try Prozac 10mg  daily and give update.  Rx to pharmacy. Return annually or prn

## 2019-03-17 ENCOUNTER — Ambulatory Visit: Payer: 59 | Attending: Internal Medicine

## 2019-03-17 ENCOUNTER — Ambulatory Visit: Payer: 59

## 2019-03-17 DIAGNOSIS — Z23 Encounter for immunization: Secondary | ICD-10-CM

## 2019-03-17 NOTE — Progress Notes (Signed)
   Covid-19 Vaccination [facility]  Name:  Brenda Nicholson    MRN: [id number] DOB: 08/28/1960  03/17/2019  [name] was observed post Covid-19 immunization for 15 minutes without incident. She was provided with Vaccine Information Sheet and instruction to access the V-Safe system.   [name] was instructed to call 911 with any severe reactions post vaccine: Marland Kitchen Difficulty breathing  . Swelling of face and throat  . A fast heartbeat  . A bad rash all over body  . Dizziness and weakness   Immunizations Administered    Name Date Dose VIS Date Route   Moderna COVID-19 Vaccine 03/17/2019 10:36 AM 0.5 mL 12/06/2018 Intramuscular   Manufacturer: Moderna   Lot: [id number]   NDC: 25638-937-34

## 2019-04-03 NOTE — Progress Notes (Signed)
Office Visit Note  Patient: Brenda Nicholson             Date of Birth: [birth year]           MRN: [id number]             PCP: Rodrigo Ran, MD Referring: Rodrigo Ran, MD Visit Date: 04/06/2019 Occupation: @GUAROCC @  Subjective:  Paresthesia of right hand.   History of Present Illness: Brenda Nicholson is a 59 y.o. female with history of positive ANA, double-stranded DNA, inflammatory arthritis, osteoarthritis.  She states she had right total knee replacement last year and had been doing really well since then.  She denies any joint swelling.  She has been experiencing some paresthesias in her right hand and her forearm.  She also developed a rash couple of weeks ago after taking Wellbutrin.  She is not sure if it was related to the medication.  The rash eventually resolved.  She was given a course of antibiotics.  No prednisone was given.  She did not have a skin biopsy.  She was also seen by Dr. 65 dermatologist.  Who thought it was a viral illness.  She was also found to have a ulcer on her palate while she went to her dentist.  Activities of Daily Living:  Patient reports morning stiffness for 0 minutes.   Patient Denies nocturnal pain.  Difficulty dressing/grooming: Denies Difficulty climbing stairs: Denies Difficulty getting out of chair: Denies Difficulty using hands for taps, buttons, cutlery, and/or writing: Denies  Review of Systems  Constitutional: Negative for fatigue, night sweats, weight gain and weight loss.  HENT: Positive for mouth sores. Negative for trouble swallowing, trouble swallowing, mouth dryness and nose dryness.   Eyes: Negative for pain, redness, itching, visual disturbance and dryness.  Respiratory: Negative for cough, shortness of breath and difficulty breathing.   Cardiovascular: Negative for chest pain, palpitations, hypertension, irregular heartbeat and swelling in legs/feet.  Gastrointestinal: Negative for blood in stool,  constipation and diarrhea.  Endocrine: Negative for increased urination.  Genitourinary: Negative for difficulty urinating, painful urination and vaginal dryness.  Musculoskeletal: Negative for arthralgias, joint pain, joint swelling, myalgias, muscle weakness, morning stiffness, muscle tenderness and myalgias.  Skin: Positive for rash. Negative for color change, hair loss, redness, skin tightness, ulcers and sensitivity to sunlight.  Allergic/Immunologic: Negative for susceptible to infections.  Neurological: Positive for parasthesias. Negative for dizziness, numbness, headaches, memory loss, night sweats and weakness.       Right hand  Hematological: Negative for bruising/bleeding tendency and swollen glands.  Psychiatric/Behavioral: Positive for depressed mood. Negative for confusion and sleep disturbance. The patient is nervous/anxious.     PMFS History:  Patient Active Problem List   Diagnosis Date Noted  . Autoimmune disease (HCC) 02/22/2017  . Status post left cataract extraction 01/12/2017  . Primary osteoarthritis of right knee 01/12/2017  . History of anxiety 01/12/2017  . History of depression 01/12/2017  . Dislocated intraocular lens 04/23/2016  . Degenerative myopia with choroidal neovascularization, right eye 12/05/2015  . Combined forms of age-related cataract of both eyes 12/05/2015  . Polyarthralgia 08/23/2015  . Myopia of both eyes 01/10/2014  . Family history of ovarian cancer 12/19/2013  . H/O amblyopia 05/10/2013  . Nuclear sclerosis 05/10/2013  . H/O: vasectomy     Past Medical History:  Diagnosis Date  . Anxiety   . ASCUS (atypical squamous cells of undetermined significance) on Pap smear 01/2007   NEG HIGH RISK HPV--BENIGN CHANGES ON 11/2007 PAP  .  Depression    in her 69's and off & on  . Myopic macular degeneration   . Osteoarthritis    Rt.knee    Family History  Problem Relation Age of Onset  . Cancer Mother 58       OVARIAN CANCER/1987  .  Hypertension Mother   . Osteoporosis Mother   . Ovarian cancer Mother        deceased  . Rheum arthritis Paternal Grandmother   . Healthy Daughter   . Healthy Daughter   . Diabetes Son   . Healthy Son   . Breast cancer Neg Hx    Past Surgical History:  Procedure Laterality Date  . BREAST CYST ASPIRATION    . CATARACT EXTRACTION Left 2018  . REPLACEMENT TOTAL KNEE Right 08/23/2018   Dr. Maureen Ralphs  . TONSILLECTOMY AND ADENOIDECTOMY     Social History   Social History Narrative  . Not on file   Immunization History  Administered Date(s) Administered  . Influenza Split 10/22/2010  . Influenza-Unspecified 10/27/2018  . Moderna SARS-COVID-2 Vaccination 03/17/2019  . Tdap 02/13/2011     Objective: Vital Signs: BP (!) 142/92 (BP Location: Right Arm, Patient Position: Sitting, Cuff Size: Normal)   Pulse 75   Resp 13   Ht 5\' 8"  (1.727 m)   Wt 161 lb (73 kg)   LMP 04/26/2014   BMI 24.48 kg/m    Physical Exam Vitals and nursing note reviewed.  Constitutional:      Appearance: She is well-developed.  HENT:     Head: Normocephalic and atraumatic.  Eyes:     Conjunctiva/sclera: Conjunctivae normal.  Cardiovascular:     Rate and Rhythm: Normal rate and regular rhythm.     Heart sounds: Normal heart sounds.  Pulmonary:     Effort: Pulmonary effort is normal.     Breath sounds: Normal breath sounds.  Abdominal:     General: Bowel sounds are normal.     Palpations: Abdomen is soft.  Musculoskeletal:     Cervical back: Normal range of motion.  Lymphadenopathy:     Cervical: No cervical adenopathy.  Skin:    General: Skin is warm and dry.     Capillary Refill: Capillary refill takes less than 2 seconds.  Neurological:     Mental Status: She is alert and oriented to person, place, and time.  Psychiatric:        Behavior: Behavior normal.      Musculoskeletal Exam: C-spine, thoracic and lumbar spine with good range of motion.  Shoulder joints, elbow joints, wrist  joints, MCPs and PIPs with good range of motion.  She had good capillary refill and no nailbed capillary changes.  Hip joints, knee joints, ankles, MTPs and PIPs with good range of motion with no synovitis.  CDAI Exam: CDAI Score: -- Patient Global: --; Provider Global: -- Swollen: --; Tender: -- Joint Exam 04/06/2019   No joint exam has been documented for this visit   There is currently no information documented on the homunculus. Go to the Rheumatology activity and complete the homunculus joint exam.  Investigation: No additional findings.  Imaging: No results found.  Recent Labs: Lab Results  Component Value Date   WBC 4.7 01/12/2017   HGB 13.6 01/12/2017   PLT 217 01/12/2017   NA 141 01/12/2017   K 4.4 01/12/2017   CL 105 01/12/2017   CO2 30 01/12/2017   GLUCOSE 92 01/12/2017   BUN 19 01/12/2017   CREATININE 0.86 01/12/2017  BILITOT 0.4 01/12/2017   ALKPHOS 48 08/23/2015   AST 16 01/12/2017   ALT 18 01/12/2017   PROT 6.1 01/12/2017   ALBUMIN 4.3 08/23/2015   CALCIUM 9.2 01/12/2017   GFRAA 86 01/12/2017    Speciality Comments: No specialty comments available.  Procedures:  No procedures performed Allergies: Bupropion   Assessment / Plan:     Visit Diagnoses: ANA positive - ANA 1:160 NH, +dsDNA, fatigue, malar rash, Raynaud's, inflammatory arthritis in the past.  She has not had any recent flares.  She has been doing well for several years.  She states she was found to have an oral ulcer by her dentist.  PLQ not recommended-patient reports history of retinal bleed., myopic.  I will obtain AVISE labs.  She supposed to notify me if she develops any new symptoms.  Raynaud's syndrome without gangrene-currently not active.  Paresthesia right hand-she had negative manual compression, negative Phalen's and negative Tinel's test.  She had good range of motion of her cervical spine.  I offered nerve conduction velocity which she declined.  I discussed use of carpal  tunnel syndrome brace.  Status post total knee replacement, right - August 23, 2018 by Dr. Despina Hick.  She has been doing well.  She has some warmth on palpation.  Rash-patient developed a rash about 2 weeks ago.  She states it was after trying Wellbutrin.  She showed me pictures of the rash on her cell phone.  She was seen by dermatologist.  She was given a course of antibiotics.  She was told that it was most likely a viral syndrome.  The rash has resolved now.  Other fatigue-improved.  Status post left cataract extraction  History of depression-she is a medications.  History of anxiety  Orders: No orders of the defined types were placed in this encounter.  No orders of the defined types were placed in this encounter.   Face-to-face time spent with patient was 30 minutes. Greater than 50% of time was spent in counseling and coordination of care.  Follow-Up Instructions: Return in about 1 year (around 04/05/2020) for +ANA.  Have advised her to contact me sooner if she develops any new symptoms.   Pollyann Savoy, MD  Note - This record has been created using Animal nutritionist.  Chart creation errors have been sought, but may not always  have been located. Such creation errors do not reflect on  the standard of medical care.

## 2019-04-06 ENCOUNTER — Ambulatory Visit (INDEPENDENT_AMBULATORY_CARE_PROVIDER_SITE_OTHER): Payer: 59 | Admitting: Rheumatology

## 2019-04-06 ENCOUNTER — Encounter: Payer: Self-pay | Admitting: Rheumatology

## 2019-04-06 ENCOUNTER — Other Ambulatory Visit: Payer: Self-pay

## 2019-04-06 VITALS — BP 142/92 | HR 75 | Resp 13 | Ht 68.0 in | Wt 161.0 lb

## 2019-04-06 DIAGNOSIS — R768 Other specified abnormal immunological findings in serum: Secondary | ICD-10-CM

## 2019-04-06 DIAGNOSIS — R21 Rash and other nonspecific skin eruption: Secondary | ICD-10-CM

## 2019-04-06 DIAGNOSIS — R5383 Other fatigue: Secondary | ICD-10-CM

## 2019-04-06 DIAGNOSIS — I7300 Raynaud's syndrome without gangrene: Secondary | ICD-10-CM | POA: Diagnosis not present

## 2019-04-06 DIAGNOSIS — Z96651 Presence of right artificial knee joint: Secondary | ICD-10-CM | POA: Diagnosis not present

## 2019-04-06 DIAGNOSIS — R202 Paresthesia of skin: Secondary | ICD-10-CM

## 2019-04-06 DIAGNOSIS — Z9842 Cataract extraction status, left eye: Secondary | ICD-10-CM

## 2019-04-06 DIAGNOSIS — Z8659 Personal history of other mental and behavioral disorders: Secondary | ICD-10-CM

## 2019-04-19 ENCOUNTER — Ambulatory Visit: Payer: 59 | Attending: Internal Medicine

## 2019-04-19 DIAGNOSIS — Z23 Encounter for immunization: Secondary | ICD-10-CM

## 2019-04-19 NOTE — Progress Notes (Signed)
   Covid-19 Vaccination Clinic  Name:  Brenda Nicholson    MRN: [id number] DOB: 09-18-1960  04/19/2019  [name] was observed post Covid-19 immunization for 15 minutes without incident. She was provided with Vaccine Information Sheet and instruction to access the V-Safe system.   [name] was instructed to call 911 with any severe reactions post vaccine: Marland Kitchen Difficulty breathing  . Swelling of face and throat  . A fast heartbeat  . A bad rash all over body  . Dizziness and weakness   Immunizations Administered    Name Date Dose VIS Date Route   Moderna COVID-19 Vaccine 04/19/2019 11:59 AM 0.5 mL 12/06/2018 Intramuscular   Manufacturer: Moderna   Lot: [id number]   NDC: 96924-932-41

## 2019-05-03 ENCOUNTER — Encounter: Payer: Self-pay | Admitting: Obstetrics & Gynecology

## 2019-05-04 ENCOUNTER — Telehealth: Payer: Self-pay

## 2019-05-04 NOTE — Telephone Encounter (Signed)
Routing to Dr Hyacinth Meeker recommendations.

## 2019-05-04 NOTE — Telephone Encounter (Signed)
Please let Mrs. Marineau know I don't know anyone personally in the [place] area.  If I was picking a practice, I'd go to [facility].  Sorry to not be more helpful.

## 2019-05-04 NOTE — Telephone Encounter (Signed)
Brenda, [place] Nicholson Clinical Pool  Phone Number: [phone]  Dr. Hyacinth Meeker,   It was good to see you for my annual visit recently! I wondered if I could ask you a question about a recommendation.  My 59 year old daughter has moved to the [facility] [place] area and is looking for a gynecologist. Do you know a doctor or practice in that area that you would recommend? It would be for routine care, but also for a breast issue (a large fibroadenoma). We are researching on our own but I thought you might have a suggestion.  Thank you very much. Hope it's ok to ask this! and hope all is going well for you and your family.   Sallye Ober

## 2019-05-05 NOTE — Telephone Encounter (Signed)
Left detailed message on pt's number per DPR. Pt given recommendations per Dr Hyacinth Meeker. Pt to call back with any questions or concerns.   Encounter closed.

## 2019-06-30 ENCOUNTER — Ambulatory Visit: Payer: 59 | Admitting: Obstetrics & Gynecology

## 2019-11-28 IMAGING — MG DIGITAL SCREENING BILATERAL MAMMOGRAM WITH TOMO AND CAD
8 series · 9 of 24 positions shown · non-contrast
Comparison: Previous exam(s).

CLINICAL DATA: Screening.

EXAM:
DIGITAL SCREENING BILATERAL MAMMOGRAM WITH TOMO AND CAD

[L CC synth-2D]
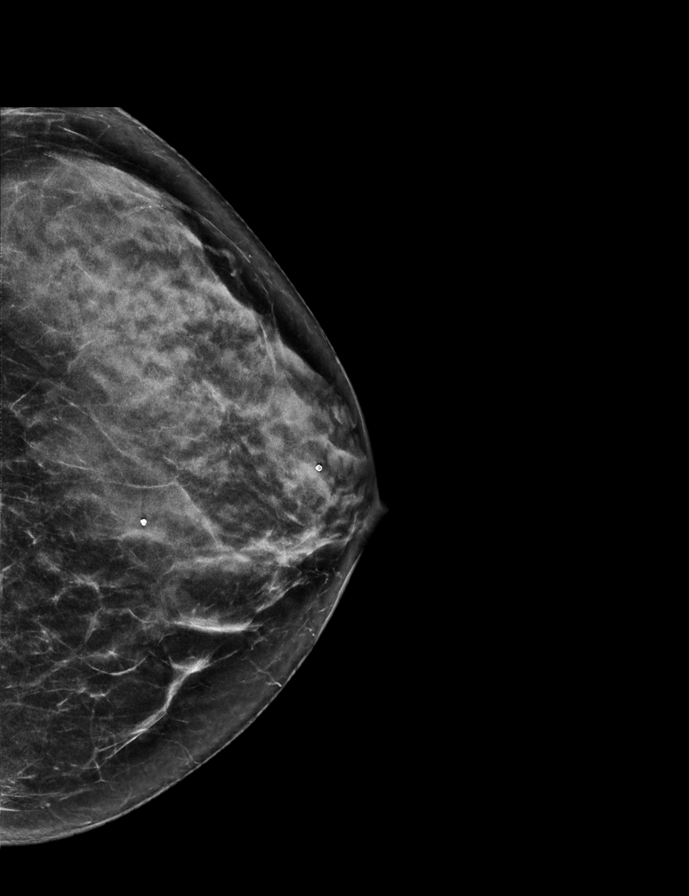

[R MLO synth-2D]
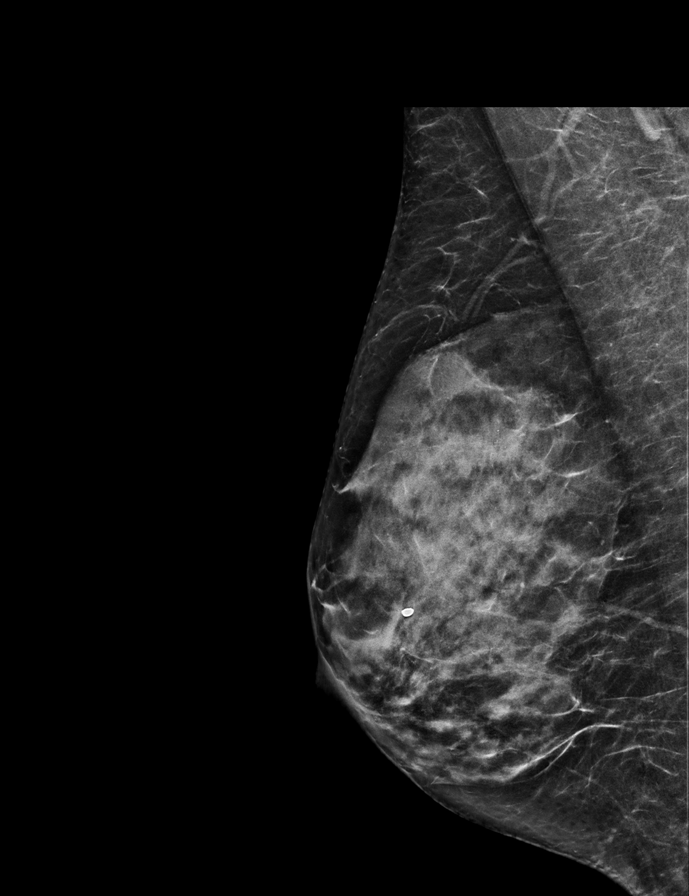

[L MLO synth-2D]
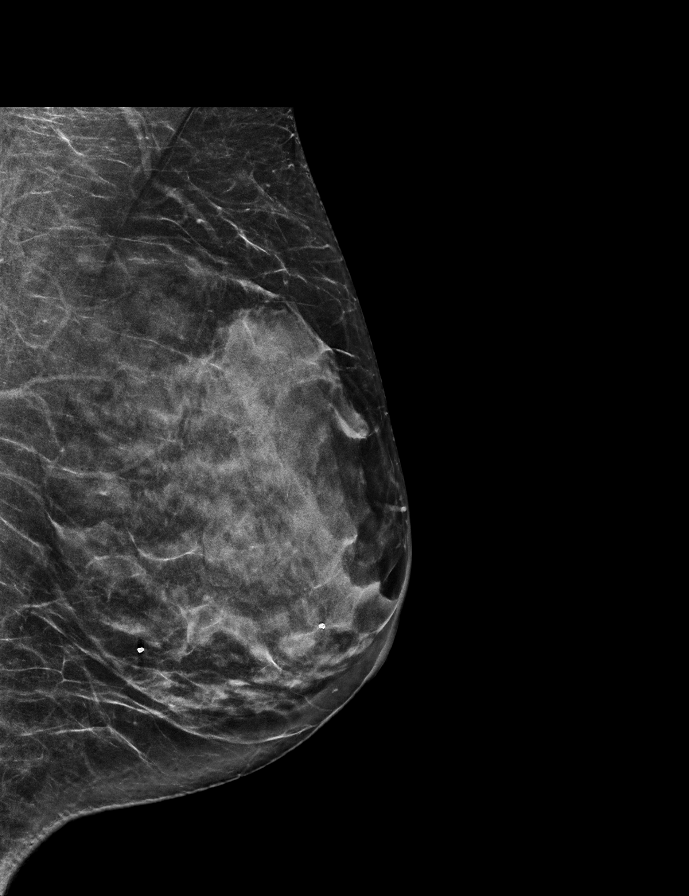

[R CC synth-2D]
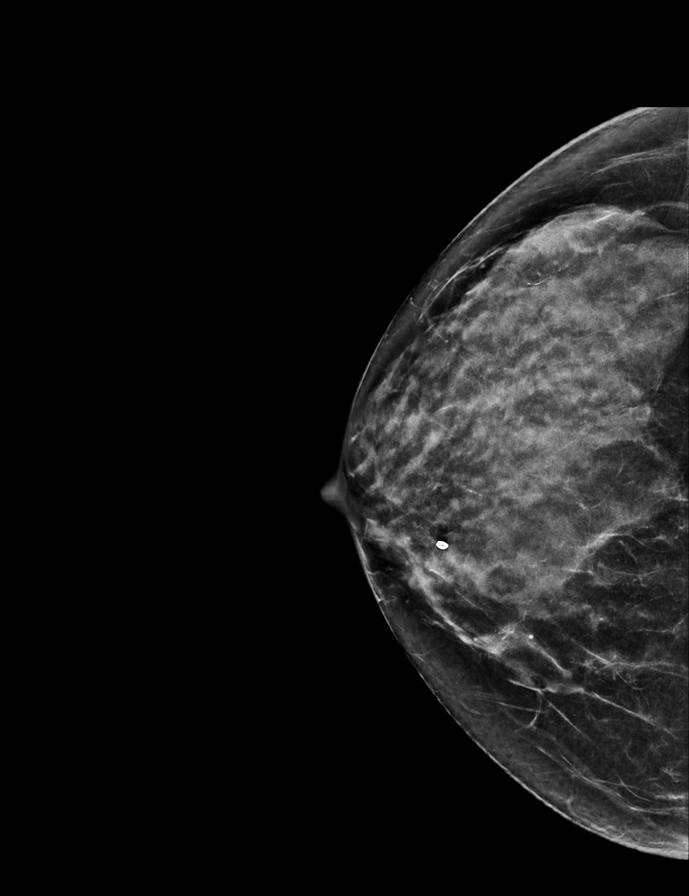

[R CC tomo · 2 of 57 frames shown]
[frame 19/57]
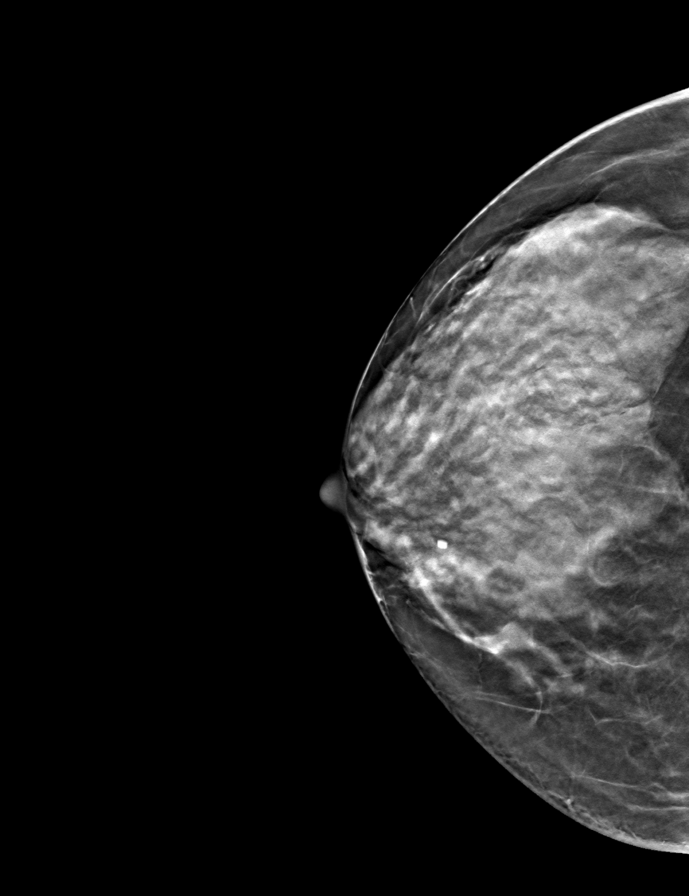
[frame 29/57]
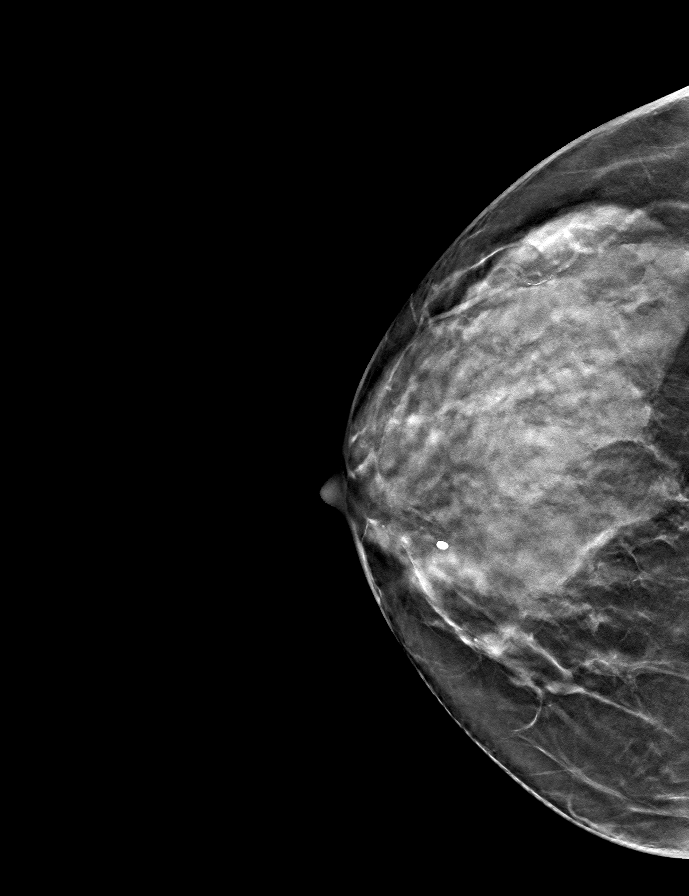

[L CC tomo · tomo slice 29/58.0]
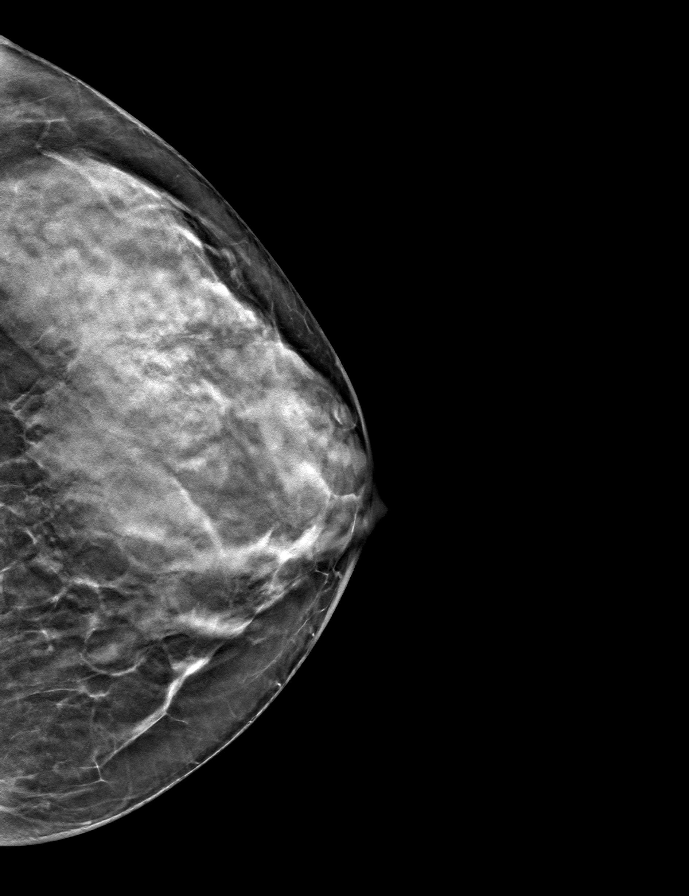

[L MLO tomo · tomo slice 31/61.0]
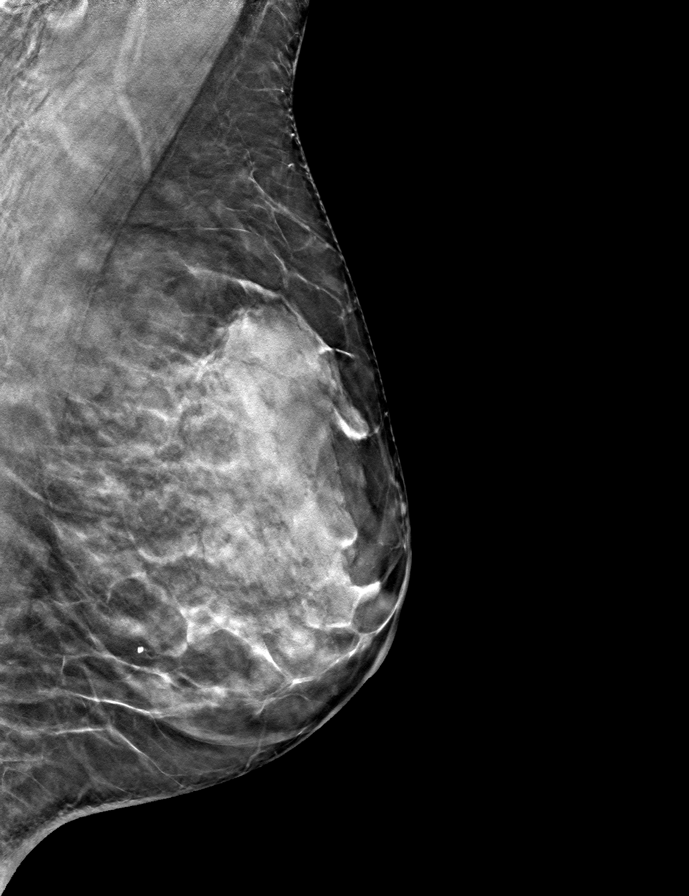

[R MLO tomo · tomo slice 31/61.0]
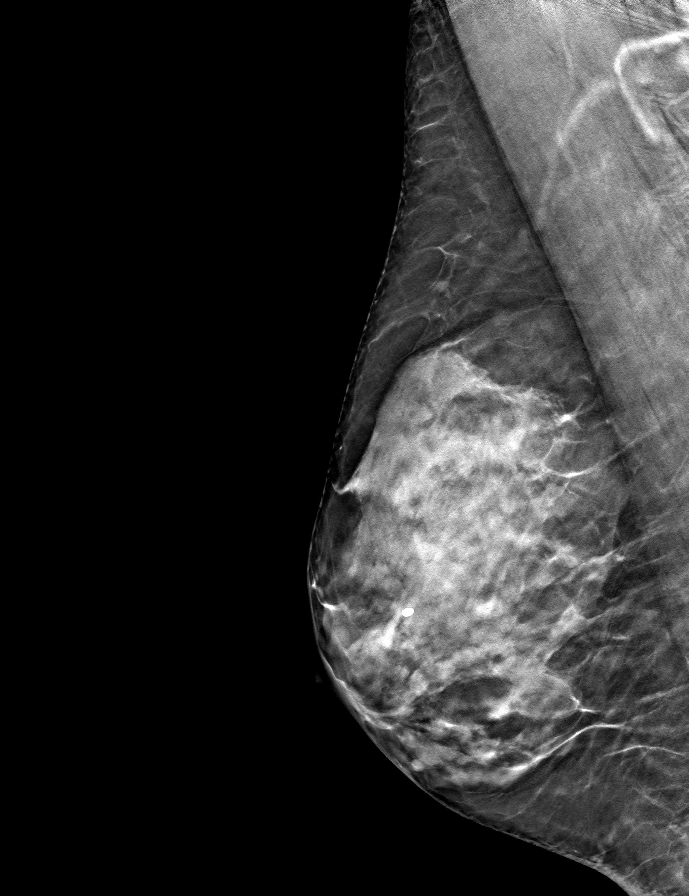

[9 of 24 positions shown; findings below may reference images not displayed]

ACR Breast Density Category d: The breast tissue is extremely dense,
which lowers the sensitivity of mammography
FINDINGS: There are no findings suspicious for malignancy. Images were
processed with CAD.
IMPRESSION: No mammographic evidence of malignancy. A result letter of this
screening mammogram will be mailed directly to the patient.

RECOMMENDATION:
Screening mammogram in one year. (Code:[id number]-[id number])

BI-RADS CATEGORY  1: Negative.

## 2020-02-23 ENCOUNTER — Encounter (HOSPITAL_BASED_OUTPATIENT_CLINIC_OR_DEPARTMENT_OTHER): Payer: Self-pay | Admitting: Obstetrics & Gynecology

## 2020-02-23 ENCOUNTER — Other Ambulatory Visit (HOSPITAL_COMMUNITY)
Admission: RE | Admit: 2020-02-23 | Discharge: 2020-02-23 | Disposition: A | Discharge status: Home / Self Care | Payer: 59 | Source: Ambulatory Visit | Attending: Obstetrics & Gynecology | Admitting: Obstetrics & Gynecology

## 2020-02-23 ENCOUNTER — Other Ambulatory Visit: Payer: Self-pay

## 2020-02-23 ENCOUNTER — Ambulatory Visit (INDEPENDENT_AMBULATORY_CARE_PROVIDER_SITE_OTHER): Payer: 59 | Admitting: Obstetrics & Gynecology

## 2020-02-23 VITALS — BP 131/79 | HR 69 | Ht 68.0 in | Wt 163.8 lb

## 2020-02-23 DIAGNOSIS — R768 Other specified abnormal immunological findings in serum: Secondary | ICD-10-CM | POA: Diagnosis not present

## 2020-02-23 DIAGNOSIS — M8580 Other specified disorders of bone density and structure, unspecified site: Secondary | ICD-10-CM | POA: Diagnosis not present

## 2020-02-23 DIAGNOSIS — Z124 Encounter for screening for malignant neoplasm of cervix: Secondary | ICD-10-CM | POA: Diagnosis present

## 2020-02-23 DIAGNOSIS — Z780 Asymptomatic menopausal state: Secondary | ICD-10-CM

## 2020-02-23 DIAGNOSIS — Z01419 Encounter for gynecological examination (general) (routine) without abnormal findings: Secondary | ICD-10-CM | POA: Diagnosis not present

## 2020-02-23 DIAGNOSIS — R7689 Other specified abnormal immunological findings in serum: Secondary | ICD-10-CM | POA: Insufficient documentation

## 2020-02-23 MED ORDER — FLUOXETINE HCL 10 MG PO TABS: 20.0000 mg | ORAL_TABLET | Freq: Every day | ORAL | 0 refills | Status: AC

## 2020-02-23 NOTE — Progress Notes (Signed)
60 y.o. G3P3 Married White or Caucasian female here for annual exam.  Doing well.  Knee replacement has been really good for her.  Denies vaginal bleeding.  Feels good with the fluoxitine.  Does have decreased libido with it.    Patient's last menstrual period was 04/26/2014.          Sexually active: Yes.    The current method of family planning is vasectomy.    Exercising: Yes.    wimming Smoker:  no  Health Maintenance: Pap:  Will obtained today History of abnormal Pap:  no MMG:  02/25/2018 Colonoscopy:  Follow up 10 years BMD:   2020, osteopenia TDaP:  02/08/2011 Pneumonia vaccine(s):  Not indicated Shingrix:   Discussed today Hep C testing: 08/23/2015 Screening Labs: [facility]   reports that she has never smoked. She has never used smokeless tobacco. She reports that she does not drink alcohol and does not use drugs.  Past Medical History:  Diagnosis Date  . Anxiety   . ASCUS (atypical squamous cells of undetermined significance) on Pap smear 01/2007   NEG HIGH RISK HPV--BENIGN CHANGES ON 11/2007 PAP  . Depression    in her 81's and off & on  . Myopic macular degeneration   . Osteoarthritis    Rt.knee    Past Surgical History:  Procedure Laterality Date  . BREAST CYST ASPIRATION    . CATARACT EXTRACTION Left 2018  . REPLACEMENT TOTAL KNEE Right 08/23/2018   Dr. Despina Hick  . TONSILLECTOMY AND ADENOIDECTOMY      Current Outpatient Medications  Medication Sig Dispense Refill  . FLUoxetine (PROZAC) 10 MG tablet Take 2 tablets (20 mg total) by mouth daily. 30 tablet 0  . Melatonin 10 MG TABS Take 10 mg by mouth as needed.     . Multiple Vitamin (MULTI-VITAMINS) TABS Take 1 tablet by mouth daily.     No current facility-administered medications for this visit.    Family History  Problem Relation Age of Onset  . Cancer Mother 36       OVARIAN CANCER/1987  . Hypertension Mother   . Osteoporosis Mother   . Ovarian cancer Mother        deceased  . Rheum arthritis Paternal  Grandmother   . Healthy Daughter   . Healthy Daughter   . Diabetes Son   . Healthy Son   . Breast cancer Neg Hx     Review of Systems  All other systems reviewed and are negative.   Exam:   BP 131/79   Pulse 69   Ht 5\' 8"  (1.727 m)   Wt 163 lb 12.8 oz (74.3 kg)   LMP 04/26/2014   SpO2 100%   BMI 24.91 kg/m   Height: 5\' 8"  (172.7 cm)  General appearance: alert, cooperative and appears stated age Head: Normocephalic, without obvious abnormality, atraumatic Neck: no adenopathy, supple, symmetrical, trachea midline and thyroid normal to inspection and palpation Lungs: clear to auscultation bilaterally Breasts: normal appearance, no masses or tenderness Heart: regular rate and rhythm Abdomen: soft, non-tender; bowel sounds normal; no masses,  no organomegaly Extremities: extremities normal, atraumatic, no cyanosis or edema Skin: Skin color, texture, turgor normal. No rashes or lesions Lymph nodes: Cervical, supraclavicular, and axillary nodes normal. No abnormal inguinal nodes palpated Neurologic: Grossly normal   Pelvic: External genitalia:  no lesions              Urethra:  normal appearing urethra with no masses, tenderness or lesions  Bartholins and Skenes: normal                 Vagina: normal appearing vagina with normal color and discharge, no lesions              Cervix: no lesions              Pap taken: Yes.   Bimanual Exam:  Uterus:  normal size, contour, position, consistency, mobility, non-tender              Adnexa: normal adnexa and no mass, fullness, tenderness               Rectovaginal: Confirms               Anus:  normal sphincter tone, no lesions  Chaperone, Beola Cord, CMA, was present for exam.  Assessment/Plan: 1. Well woman exam with routine gynecological exam - pap and HR HPV obtained today - MMG is overdue.  Pt aware. - colonoscopy up to date - plan BMD again in 2 years - vaccines reviewed - lab word done with Dr.  Delena Serve  2. Postmenopausal - not on HRT  3. Osteopenia, unspecified location  4. Positive ANA (antinuclear antibody) - has seen Dr. Bedelia Person.  Had no definitive diagnosis.

## 2020-02-27 LAB — CYTOLOGY - PAP
Comment: NEGATIVE
Diagnosis: NEGATIVE
High risk HPV: NEGATIVE

## 2020-03-06 ENCOUNTER — Other Ambulatory Visit: Payer: Self-pay | Admitting: Obstetrics & Gynecology

## 2020-03-06 DIAGNOSIS — Z1231 Encounter for screening mammogram for malignant neoplasm of breast: Secondary | ICD-10-CM

## 2020-04-04 ENCOUNTER — Ambulatory Visit: Payer: 59 | Admitting: Rheumatology

## 2020-04-19 DIAGNOSIS — Z1231 Encounter for screening mammogram for malignant neoplasm of breast: Secondary | ICD-10-CM

## 2020-04-24 ENCOUNTER — Inpatient Hospital Stay: Admission: RE | Admit: 2020-04-24 | Payer: 59 | Source: Ambulatory Visit

## 2020-05-08 ENCOUNTER — Other Ambulatory Visit: Payer: Self-pay

## 2020-05-08 ENCOUNTER — Ambulatory Visit
Admission: RE | Admit: 2020-05-08 | Discharge: 2020-05-08 | Disposition: A | Discharge status: Home / Self Care | Payer: 59 | Source: Ambulatory Visit | Attending: Obstetrics & Gynecology | Admitting: Obstetrics & Gynecology

## 2020-05-08 DIAGNOSIS — Z1231 Encounter for screening mammogram for malignant neoplasm of breast: Secondary | ICD-10-CM

## 2020-05-17 ENCOUNTER — Ambulatory Visit: Payer: 59

## 2020-06-04 ENCOUNTER — Encounter (HOSPITAL_BASED_OUTPATIENT_CLINIC_OR_DEPARTMENT_OTHER): Payer: Self-pay

## 2021-02-24 ENCOUNTER — Ambulatory Visit (HOSPITAL_BASED_OUTPATIENT_CLINIC_OR_DEPARTMENT_OTHER): Payer: 59 | Admitting: Obstetrics & Gynecology

## 2021-02-27 ENCOUNTER — Other Ambulatory Visit: Payer: Self-pay

## 2021-02-27 ENCOUNTER — Encounter (HOSPITAL_BASED_OUTPATIENT_CLINIC_OR_DEPARTMENT_OTHER): Payer: Self-pay | Admitting: Obstetrics & Gynecology

## 2021-02-27 ENCOUNTER — Ambulatory Visit (INDEPENDENT_AMBULATORY_CARE_PROVIDER_SITE_OTHER): Payer: 59 | Admitting: Obstetrics & Gynecology

## 2021-02-27 VITALS — BP 137/77 | HR 71 | Ht 68.0 in | Wt 161.4 lb

## 2021-02-27 DIAGNOSIS — M8580 Other specified disorders of bone density and structure, unspecified site: Secondary | ICD-10-CM

## 2021-02-27 DIAGNOSIS — Z780 Asymptomatic menopausal state: Secondary | ICD-10-CM

## 2021-02-27 DIAGNOSIS — Z1231 Encounter for screening mammogram for malignant neoplasm of breast: Secondary | ICD-10-CM | POA: Diagnosis not present

## 2021-02-27 DIAGNOSIS — Z23 Encounter for immunization: Secondary | ICD-10-CM | POA: Diagnosis not present

## 2021-02-27 DIAGNOSIS — Z01419 Encounter for gynecological examination (general) (routine) without abnormal findings: Secondary | ICD-10-CM | POA: Diagnosis not present

## 2021-02-27 NOTE — Progress Notes (Signed)
61 y.o. G3P3 Married White or Caucasian female here for annual exam.  Doing well.  Denies vaginal bleeding.    Patient's last menstrual period was 04/26/2014.          Sexually active: Yes.    The current method of family planning is post menopausal status.    Smoker:  no  Health Maintenance: Pap:  02/23/2020 Negative History of abnormal Pap:  no MMG:  05/08/2020 Negative Colonoscopy:  02/26/2014 BMD:   01/11/2018 Osteopenia Screening Labs: does with Dr. Waynard Edwards   reports that she has never smoked. She has never used smokeless tobacco. She reports that she does not drink alcohol and does not use drugs.  Past Medical History:  Diagnosis Date   Anxiety    Depression    in her 36's and off & on   Myopic macular degeneration    Osteoarthritis    Rt.knee    Past Surgical History:  Procedure Laterality Date   BREAST CYST ASPIRATION     CATARACT EXTRACTION Left 2018   REPLACEMENT TOTAL KNEE Right 08/23/2018   Dr. Despina Hick   TONSILLECTOMY AND ADENOIDECTOMY      Current Outpatient Medications  Medication Sig Dispense Refill   FLUoxetine (PROZAC) 10 MG tablet Take 2 tablets (20 mg total) by mouth daily. 30 tablet 0   Melatonin 10 MG TABS Take 10 mg by mouth as needed.      Multiple Vitamin (MULTI-VITAMINS) TABS Take 1 tablet by mouth daily.     No current facility-administered medications for this visit.    Family History  Problem Relation Age of Onset   Cancer Mother 74       OVARIAN CANCER/1987   Hypertension Mother    Osteoporosis Mother    Ovarian cancer Mother        deceased   Rheum arthritis Paternal Grandmother    Healthy Daughter    Healthy Daughter    Diabetes Son    Healthy Son    Breast cancer Neg Hx     Review of Systems  All other systems reviewed and are negative.  Exam:   BP 137/77 (BP Location: Left Arm, Patient Position: Sitting, Cuff Size: Normal)    Pulse 71    Ht 5\' 8"  (1.727 m) Comment: reported   Wt 161 lb 6.4 oz (73.2 kg)    LMP 04/26/2014     BMI 24.54 kg/m   Height: 5\' 8"  (172.7 cm) (reported)  General appearance: alert, cooperative and appears stated age Head: Normocephalic, without obvious abnormality, atraumatic Neck: no adenopathy, supple, symmetrical, trachea midline and thyroid normal to inspection and palpation Lungs: clear to auscultation bilaterally Breasts: normal appearance, no masses or tenderness Heart: regular rate and rhythm Abdomen: soft, non-tender; bowel sounds normal; no masses,  no organomegaly Extremities: extremities normal, atraumatic, no cyanosis or edema Skin: Skin color, texture, turgor normal. No rashes or lesions Lymph nodes: Cervical, supraclavicular, and axillary nodes normal. No abnormal inguinal nodes palpated Neurologic: Grossly normal   Pelvic: External genitalia:  no lesions              Urethra:  normal appearing urethra with no masses, tenderness or lesions              Bartholins and Skenes: normal                 Vagina: normal appearing vagina with normal color and no discharge, no lesions  Cervix: no lesions              Pap taken: No. Bimanual Exam:  Uterus:  normal size, contour, position, consistency, mobility, non-tender              Adnexa: normal adnexa and no mass, fullness, tenderness               Rectovaginal: Confirms               Anus:  normal sphincter tone, no lesions  Chaperone, Ina Homes, CMA, was present for exam.  Assessment/Plan: 1. Well woman exam with routine gynecological exam - pap and HR HPV neg 2022 - plan BMD next year - colonoscopy 2016 - lab work with PCP - Tdap vaccine greater than or equal to 7yo IM  2. Encounter for screening mammogram for malignant neoplasm of breast - MM 3D SCREEN BREAST BILATERAL; Future  3. Osteopenia, unspecified location  4. Postmenopausal

## 2021-05-16 ENCOUNTER — Ambulatory Visit (HOSPITAL_BASED_OUTPATIENT_CLINIC_OR_DEPARTMENT_OTHER): Payer: 59 | Admitting: Radiology

## 2022-02-23 ENCOUNTER — Encounter (HOSPITAL_BASED_OUTPATIENT_CLINIC_OR_DEPARTMENT_OTHER): Payer: Self-pay | Admitting: Obstetrics & Gynecology

## 2022-03-09 ENCOUNTER — Ambulatory Visit (HOSPITAL_BASED_OUTPATIENT_CLINIC_OR_DEPARTMENT_OTHER)
Admission: RE | Admit: 2022-03-09 | Discharge: 2022-03-09 | Disposition: A | Discharge status: Home / Self Care | Payer: Managed Care, Other (non HMO) | Source: Ambulatory Visit | Attending: Obstetrics & Gynecology | Admitting: Obstetrics & Gynecology

## 2022-03-09 ENCOUNTER — Encounter (HOSPITAL_BASED_OUTPATIENT_CLINIC_OR_DEPARTMENT_OTHER): Payer: Self-pay | Admitting: Obstetrics & Gynecology

## 2022-03-09 ENCOUNTER — Ambulatory Visit (INDEPENDENT_AMBULATORY_CARE_PROVIDER_SITE_OTHER): Payer: Managed Care, Other (non HMO) | Admitting: Obstetrics & Gynecology

## 2022-03-09 VITALS — BP 128/84 | HR 70 | Ht 68.0 in | Wt 161.8 lb

## 2022-03-09 DIAGNOSIS — R768 Other specified abnormal immunological findings in serum: Secondary | ICD-10-CM

## 2022-03-09 DIAGNOSIS — Z01419 Encounter for gynecological examination (general) (routine) without abnormal findings: Secondary | ICD-10-CM | POA: Diagnosis not present

## 2022-03-09 DIAGNOSIS — M8580 Other specified disorders of bone density and structure, unspecified site: Secondary | ICD-10-CM | POA: Diagnosis not present

## 2022-03-09 DIAGNOSIS — Z780 Asymptomatic menopausal state: Secondary | ICD-10-CM

## 2022-03-09 DIAGNOSIS — Z8041 Family history of malignant neoplasm of ovary: Secondary | ICD-10-CM | POA: Diagnosis not present

## 2022-03-09 DIAGNOSIS — Z1231 Encounter for screening mammogram for malignant neoplasm of breast: Secondary | ICD-10-CM

## 2022-03-09 NOTE — Progress Notes (Signed)
62 y.o. G3P3 Married White or Caucasian female here for annual exam.  Doing well.  Denies vaginal bleeding.  Reports she did not go for MMG last year.    Saw Dr. Joylene Draft last year.  He does blood work.  Patient's last menstrual period was 04/06/2014.          Sexually active: husband with ED due to prostatectomy  The current method of family planning is post menopausal status.    Smoker:  no  Health Maintenance: Pap:  02/23/2020 Negative History of abnormal Pap:  no MMG:  05/08/2020 Negative Colonoscopy:  02/26/2014, follow up 10 years BMD:   01/11/2018, -1.8 Screening Labs: does with Dr. Joylene Draft   reports that she has never smoked. She has never used smokeless tobacco. She reports that she does not drink alcohol and does not use drugs.  Past Medical History:  Diagnosis Date   Anxiety    Depression    in her 39's and off & on   Myopic macular degeneration    Osteoarthritis    Rt.knee    Past Surgical History:  Procedure Laterality Date   BREAST CYST ASPIRATION     CATARACT EXTRACTION Left 2018   REPLACEMENT TOTAL KNEE Right 08/23/2018   Dr. Maureen Ralphs   TONSILLECTOMY AND ADENOIDECTOMY      Current Outpatient Medications  Medication Sig Dispense Refill   FLUoxetine (PROZAC) 10 MG tablet Take 2 tablets (20 mg total) by mouth daily. 30 tablet 0   Melatonin 10 MG TABS Take 10 mg by mouth as needed.      Multiple Vitamin (MULTI-VITAMINS) TABS Take 1 tablet by mouth daily.     No current facility-administered medications for this visit.    Family History  Problem Relation Age of Onset   Cancer Mother 44       OVARIAN CANCER/1987   Hypertension Mother    Osteoporosis Mother    Ovarian cancer Mother        deceased   Rheum arthritis Paternal 40    Healthy Daughter    Healthy Daughter    Diabetes Son    Healthy Son    Breast cancer Neg Hx     ROS: Constitutional: negative Genitourinary:negative  Exam:   BP 128/84   Pulse 70   Ht '5\' 8"'$  (1.727 m) Comment:  Reported  Wt 161 lb 12.8 oz (73.4 kg)   LMP 04/06/2014   BMI 24.60 kg/m   Height: '5\' 8"'$  (172.7 cm) (Reported)  General appearance: alert, cooperative and appears stated age Head: Normocephalic, without obvious abnormality, atraumatic Neck: no adenopathy, supple, symmetrical, trachea midline and thyroid normal to inspection and palpation Lungs: clear to auscultation bilaterally Breasts: normal appearance, no masses or tenderness Heart: regular rate and rhythm Abdomen: soft, non-tender; bowel sounds normal; no masses,  no organomegaly Extremities: extremities normal, atraumatic, no cyanosis or edema Skin: Skin color, texture, turgor normal. No rashes or lesions Lymph nodes: Cervical, supraclavicular, and axillary nodes normal. No abnormal inguinal nodes palpated Neurologic: Grossly normal   Pelvic: External genitalia:  no lesions              Urethra:  normal appearing urethra with no masses, tenderness or lesions              Bartholins and Skenes: normal                 Vagina: normal appearing vagina with normal color and no discharge, no lesions  Cervix: no lesions              Pap taken: No. Bimanual Exam:  Uterus:  normal size, contour, position, consistency, mobility, non-tender              Adnexa: normal adnexa and no mass, fullness, tenderness               Rectovaginal: Confirms               Anus:  normal sphincter tone, no lesions  Chaperone, Octaviano Batty, CMA, was present for exam.  Assessment/Plan: 1. Well woman exam with routine gynecological exam - Pap smear neg 2022.  Will repeat next year. - Mammogram scheduled today. - Colonoscopy 2016.  Follow up 10 years. - Bone mineral density ordered - lab work done with PCP, Dr. Joylene Draft - vaccines reviewed/updated  2. Encounter for screening mammogram for malignant neoplasm of breast - MM 3D SCREEN BREAST BILATERAL; Future  3. Osteopenia, unspecified location - DG BONE DENSITY (DXA); Future  4. Family  history of ovarian cancer  5. Postmenopausal - not on HRT  6. Positive ANA (antinuclear antibody) - has seen Dr. Dora Sims

## 2023-07-26 ENCOUNTER — Other Ambulatory Visit: Payer: Self-pay | Admitting: Obstetrics & Gynecology

## 2023-07-26 DIAGNOSIS — Z1231 Encounter for screening mammogram for malignant neoplasm of breast: Secondary | ICD-10-CM

## 2023-07-28 ENCOUNTER — Ambulatory Visit
Admission: RE | Admit: 2023-07-28 | Discharge: 2023-07-28 | Disposition: A | Discharge status: Home / Self Care | Source: Ambulatory Visit

## 2023-07-28 DIAGNOSIS — Z1231 Encounter for screening mammogram for malignant neoplasm of breast: Secondary | ICD-10-CM

## 2023-12-21 ENCOUNTER — Encounter: Payer: Self-pay | Admitting: Gastroenterology

## 2024-02-11 ENCOUNTER — Encounter: Payer: Self-pay | Admitting: Gastroenterology

## 2024-02-11 ENCOUNTER — Ambulatory Visit: Admitting: Gastroenterology

## 2024-02-11 VITALS — BP 134/68 | HR 85 | Ht 68.0 in | Wt 160.0 lb

## 2024-02-11 DIAGNOSIS — K5902 Outlet dysfunction constipation: Secondary | ICD-10-CM

## 2024-02-11 DIAGNOSIS — Z1211 Encounter for screening for malignant neoplasm of colon: Secondary | ICD-10-CM

## 2024-02-11 DIAGNOSIS — K5904 Chronic idiopathic constipation: Secondary | ICD-10-CM

## 2024-02-11 DIAGNOSIS — M6289 Other specified disorders of muscle: Secondary | ICD-10-CM

## 2024-02-11 MED ORDER — NA SULFATE-K SULFATE-MG SULF 17.5-3.13-1.6 GM/177ML PO SOLN: 1.0000 | Freq: Once | ORAL | 0 refills | Status: AC

## 2024-02-11 NOTE — Patient Instructions (Signed)
 You have been scheduled for a colonoscopy. Please follow written instructions given to you at your visit today.   If you use inhalers (even only as needed), please bring them with you on the day of your procedure.  DO NOT TAKE 7 DAYS PRIOR TO TEST- Trulicity (dulaglutide) Ozempic, Wegovy (semaglutide) Mounjaro, Zepbound (tirzepatide) Bydureon Bcise (exanatide extended release)  DO NOT TAKE 1 DAY PRIOR TO YOUR TEST Rybelsus (semaglutide) Adlyxin (lixisenatide) Victoza (liraglutide) Byetta (exanatide) ___________________________________________________________________________    We have referred you to Pelvic Floor Physical therapy, they will contact you with an appointment  Take Benefiber 1 teaspoon three times a day  Use Dulcolax at bedtime  Due to recent changes in healthcare laws, you may see the results of your imaging and laboratory studies on MyChart before your provider has had a chance to review them.  We understand that in some cases there may be results that are confusing or concerning to you. Not all laboratory results come back in the same time frame and the provider may be waiting for multiple results in order to interpret others.  Please give us  48 hours in order for your provider to thoroughly review all the results before contacting the office for clarification of your results.    I appreciate the  opportunity to care for you  Thank You   Kavitha Nandigam , MD

## 2024-02-11 NOTE — Progress Notes (Signed)
 "                Brenda Nicholson    [id number]    07-20-1960  Primary Care Physician:Miller, Ronal RAMAN, MD  Referring Physician: Cleotilde Ronal RAMAN, MD [address] [place],  [place] [address]   Chief complaint:  Constipation  Discussed the use of AI scribe software for clinical note transcription with the patient, who gave verbal consent to proceed.  History of Present Illness Brenda Nicholson is a 64 year old very pleasant female here to establish GI care, history of chronic idiopathic constipation  She has been experiencing constipation for past few years, worse in the last 5 years.  She has fragmentation of stool and incomplete evacuation, sometimes has to splint the perineum to evacuate.  She has noticed some improvement with Metamucil but continues to have sensation of incomplete evacuation and fragmentation of stool. Denies any rectal bleeding or abdominal pain.  She is due for colorectal cancer screening, last colonoscopy was 10 years ago      Outpatient Encounter Medications as of 02/11/2024  Medication Sig   FLUoxetine  (PROZAC ) 10 MG tablet Take 2 tablets (20 mg total) by mouth daily.   Melatonin 10 MG TABS Take 10 mg by mouth as needed.    Multiple Vitamin (MULTI-VITAMINS) TABS Take 1 tablet by mouth daily.   No facility-administered encounter medications on file as of 02/11/2024.    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 Hiram   TONSILLECTOMY AND ADENOIDECTOMY      Family History  Problem Relation Age of Onset   Cancer Mother 48       OVARIAN CANCER/1987    Hypertension Mother    Osteoporosis Mother    Ovarian cancer Mother        deceased   Rheum arthritis Paternal Grandmother    Healthy Daughter    Healthy Daughter    Diabetes Son    Healthy Son    Breast cancer Neg Hx     Social History   Socioeconomic History   Marital status: Married    Spouse name: Not on file   Number of children: Not on file   Years of education: Not on file   Highest education level: Not on file  Occupational History   Not on file  Tobacco Use   Smoking status: Never   Smokeless tobacco: Never  Vaping Use   Vaping status: Never Used  Substance and Sexual Activity   Alcohol use: No    Alcohol/week: 0.0 standard drinks of alcohol   Drug use: No   Sexual activity: Yes    Partners: Male    Birth control/protection: Other-see comments    Comment: husband with vasectomy  Other Topics Concern   Not on file  Social History Narrative   Not on file   Social Drivers of Health   Tobacco Use: Low Risk (09/07/2023)   Received from [facility]   Patient History    Smoking Tobacco Use: Never  Smokeless Tobacco Use: Never    Passive Exposure: Not on file  Financial Resource Strain: Not on file  Food Insecurity: Not on file  Transportation Needs: Not on file  Physical Activity: Not on file  Stress: Not on file  Social Connections: Not on file  Intimate Partner Violence: Not on file  Depression (PHQ2-9): Low Risk (03/09/2022)   Depression (PHQ2-9)    PHQ-2 Score: 0  Alcohol Screen: Not on file  Housing: Not on file  Utilities: Not on file  Health Literacy: Not on file      Review of systems: All other review of systems negative except as mentioned in the HPI.   Physical Exam: Vitals:   02/11/24 1353  BP: 134/68  Pulse: 85   Body mass index is 24.33 kg/m. Gen:      No acute distress HEENT:  sclera anicteric CV: s1s2 rrr, no murmur Lungs: B/l clear. Abd:      soft, non-tender; no palpable masses, no distension Ext:    No  edema Neuro: alert and oriented x 3 Psych: normal mood and affect  Data Reviewed:  Reviewed labs, radiology imaging, old records and pertinent past GI work up   Assessment & Plan Colorectal cancer screening Schedule colonoscopy for average risk colorectal cancer screening 2-day bowel prep The risks and benefits as well as alternatives of endoscopic procedure(s) have been discussed and reviewed. All questions answered. The patient agrees to proceed.   Chronic idiopathic constipation: Start Benefiber 1 tablespoon 2-3 times daily with meals.  Add docusate 1 capsule at bedtime as needed  Pelvic floor dysfunction and dyssynergy defecation: Refer to pelvic floor physical therapy If continues to have pelvic floor dysfunction, will consider MRI defecography and anorectal manometry for further evaluation       The patient was provided an opportunity to ask questions and all were answered. The patient agreed with the plan and demonstrated an understanding of the instructions.  [name] , MD    CC: Cleotilde Ronal RAMAN, MD    "

## 2024-03-22 ENCOUNTER — Encounter: Admitting: Gastroenterology

## 2024-03-22 ENCOUNTER — Ambulatory Visit (HOSPITAL_BASED_OUTPATIENT_CLINIC_OR_DEPARTMENT_OTHER): Admitting: Obstetrics & Gynecology
